# Patient Record
Sex: Male | Born: 1985 | Race: White | Hispanic: No | Marital: Single | Smoking: Former smoker
Health system: Southern US, Community
[De-identification: ages and names within clinical notes are randomized; demographics above are authoritative.]

## PROBLEM LIST (undated history)

## (undated) DIAGNOSIS — Z872 Personal history of diseases of the skin and subcutaneous tissue: Secondary | ICD-10-CM

## (undated) HISTORY — DX: Personal history of diseases of the skin and subcutaneous tissue: Z87.2

## (undated) HISTORY — PX: WISDOM TOOTH EXTRACTION: SHX21

---

## 2019-11-07 ENCOUNTER — Encounter: Payer: Self-pay | Admitting: Osteopathic Medicine

## 2019-11-07 ENCOUNTER — Other Ambulatory Visit: Payer: Self-pay

## 2019-11-07 ENCOUNTER — Ambulatory Visit (INDEPENDENT_AMBULATORY_CARE_PROVIDER_SITE_OTHER): Payer: Managed Care, Other (non HMO)

## 2019-11-07 ENCOUNTER — Ambulatory Visit (INDEPENDENT_AMBULATORY_CARE_PROVIDER_SITE_OTHER): Payer: Managed Care, Other (non HMO) | Admitting: Osteopathic Medicine

## 2019-11-07 VITALS — BP 144/82 | HR 85 | Temp 98.2°F | Ht 74.0 in | Wt 265.0 lb

## 2019-11-07 DIAGNOSIS — Z872 Personal history of diseases of the skin and subcutaneous tissue: Secondary | ICD-10-CM

## 2019-11-07 DIAGNOSIS — M25472 Effusion, left ankle: Secondary | ICD-10-CM | POA: Insufficient documentation

## 2019-11-07 DIAGNOSIS — Z8249 Family history of ischemic heart disease and other diseases of the circulatory system: Secondary | ICD-10-CM

## 2019-11-07 DIAGNOSIS — Z833 Family history of diabetes mellitus: Secondary | ICD-10-CM | POA: Diagnosis not present

## 2019-11-07 DIAGNOSIS — R030 Elevated blood-pressure reading, without diagnosis of hypertension: Secondary | ICD-10-CM | POA: Diagnosis not present

## 2019-11-07 MED ORDER — MELOXICAM 15 MG PO TABS: ORAL_TABLET | ORAL | 3 refills | Status: DC

## 2019-11-07 NOTE — Progress Notes (Signed)
Subjective:    I'm seeing this patient as a consultation for: Dr. Emeterio Reeve  CC: Left ankle swelling  HPI: For over a month now this 33 year old male has had pain in his left ankle, worse with the first few steps in the morning and after sitting for a while, also worse with HIIT.  Localized at the talar dome is without radiation, moderate swelling, no known trauma.  I reviewed the past medical history, family history, social history, surgical history, and allergies today and no changes were needed.  Please see the problem list section below in epic for further details.  Past Medical History: Past Medical History:  Diagnosis Date  . History of acne    Past Surgical History: Past Surgical History:  Procedure Laterality Date  . WISDOM TOOTH EXTRACTION     Social History: Social History   Socioeconomic History  . Marital status: Single    Spouse name: Not on file  . Number of children: Not on file  . Years of education: Not on file  . Highest education level: Not on file  Occupational History  . Occupation: Lobbyist: Information systems manager   Tobacco Use  . Smoking status: Former Smoker    Years: 5.00    Types: Cigarettes    Quit date: 2016    Years since quitting: 4.9  . Smokeless tobacco: Never Used  Substance and Sexual Activity  . Alcohol use: Yes    Alcohol/week: 2.0 standard drinks    Types: 2 Standard drinks or equivalent per week  . Drug use: Not Currently    Types: Marijuana    Comment: many years ago  . Sexual activity: Not Currently    Partners: Female    Birth control/protection: Condom  Other Topics Concern  . Not on file  Social History Narrative  . Not on file   Social Determinants of Health   Financial Resource Strain:   . Difficulty of Paying Living Expenses: Not on file  Food Insecurity:   . Worried About Charity fundraiser in the Last Year: Not on file  . Ran Out of Food in the Last Year: Not on file  Transportation Needs:    . Lack of Transportation (Medical): Not on file  . Lack of Transportation (Non-Medical): Not on file  Physical Activity:   . Days of Exercise per Week: Not on file  . Minutes of Exercise per Session: Not on file  Stress:   . Feeling of Stress : Not on file  Social Connections:   . Frequency of Communication with Friends and Family: Not on file  . Frequency of Social Gatherings with Friends and Family: Not on file  . Attends Religious Services: Not on file  . Active Member of Clubs or Organizations: Not on file  . Attends Archivist Meetings: Not on file  . Marital Status: Not on file   Family History: Family History  Problem Relation Age of Onset  . Diabetes Mother   . High blood pressure Mother   . Diabetes Father   . High blood pressure Father   . Cancer Father   . Heart attack Maternal Grandfather    Allergies: Not on File Medications: See med rec.  Review of Systems: No headache, visual changes, nausea, vomiting, diarrhea, constipation, dizziness, abdominal pain, skin rash, fevers, chills, night sweats, weight loss, swollen lymph nodes, body aches, joint swelling, muscle aches, chest pain, shortness of breath, mood changes, visual or auditory hallucinations.  Objective:   General: Well Developed, well nourished, and in no acute distress.  Neuro:  Extra-ocular muscles intact, able to move all 4 extremities, sensation grossly intact.  Deep tendon reflexes tested were normal. Psych: Alert and oriented, mood congruent with affect. ENT:  Ears and nose appear unremarkable.  Hearing grossly normal. Neck: Unremarkable overall appearance, trachea midline.  No visible thyroid enlargement. Eyes: Conjunctivae and lids appear unremarkable.  Pupils equal and round. Skin: Warm and dry, no rashes noted.  Cardiovascular: Pulses palpable, no extremity edema. Left ankle: Mild visible and palpable swelling with a fluid wave. Range of motion is full in all directions. Strength  is 5/5 in all directions. Stable lateral and medial ligaments; squeeze test and kleiger test unremarkable; Tender palpation at the medial and lateral talar domes. No pain at base of 5th MT; No tenderness over cuboid; No tenderness over N spot or navicular prominence No tenderness on posterior aspects of lateral and medial malleolus No sign of peroneal tendon subluxations; Negative tarsal tunnel tinel's Able to walk 4 steps.  Impression and Recommendations:   This case required medical decision making of moderate complexity.  Left ankle swelling Likely internal derangement, ankle swelling with tenderness over the medial and lateral talar domes. He will cross train for the next month, nonimpact. There is significant gelling and morning stiffness. If insufficient improvement at the follow-up visit we will consider a rheumatoid work-up. Adding meloxicam, x-rays, I would like him to see Dr. Raeford Razor for custom molded orthotics. Return to see me in a month, MRI if no better.   ___________________________________________ Gwen Her. Dianah Field, M.D., ABFM., CAQSM. Primary Care and Sports Medicine [facility]  Adjunct Professor of [employer] [facility] [employer]

## 2019-11-07 NOTE — Progress Notes (Signed)
HPI: Barry Herrera is a 33 y.o. male who  has no past medical history on file.  he presents to [facility] today, 11/07/19,  for chief complaint of: New to establish care Elevated BP  Foot pain   Foot/Ankle issue - consult sports medicine, see Dr. Mcneil Sober notes for full details.  Skin concern on back of neck, looks like a bruise but it is a really strange shape and patient cannot recall how he might of gotten this.  Does not really hurt him.  No itching or other irritation.  Blood pressure elevated on intake and on recheck.  Patient states that his BP tends to run high when checked in doctor/dentist offices but he has never been told he has high blood pressure.  He has a home blood pressure monitor that he does not really use.    Past medical, surgical, social and family history reviewed:  Patient Active Problem List   Diagnosis Date Noted  . Left ankle swelling 11/07/2019    Past Surgical History:  Procedure Laterality Date  . WISDOM TOOTH EXTRACTION      Social History   Tobacco Use  . Smoking status: Former Smoker    Years: 5.00    Types: Cigarettes    Quit date: 2016    Years since quitting: 4.9  . Smokeless tobacco: Never Used  Substance Use Topics  . Alcohol use: Yes    Alcohol/week: 2.0 standard drinks    Types: 2 Standard drinks or equivalent per week    Family History  Problem Relation Age of Onset  . Diabetes Mother   . High blood pressure Mother   . Diabetes Father   . High blood pressure Father   . Cancer Father   . Heart attack Maternal Grandfather      Current medication list and allergy/intolerance information reviewed:    No outpatient medications have been marked as taking for the 11/07/19 encounter (Office Visit) with Emeterio Reeve, DO.    Not on File    Review of Systems:  Constitutional:  No  fever, no chills, No recent illness, No unintentional weight changes. No significant fatigue.   HEENT:  No  headache, no vision change, no hearing change, No sore throat, No  sinus pressure  Cardiac: No  chest pain, No  pressure, No palpitations, No  Orthopnea  Respiratory:  No  shortness of breath. No  Cough  Gastrointestinal: No  abdominal pain, No  nausea, No  vomiting,  No  blood in stool, No  diarrhea, No  constipation   Musculoskeletal: +new myalgia/arthralgia  Skin: +Rash, No other wounds/concerning lesions  Genitourinary: No  incontinence, No  abnormal genital bleeding, No abnormal genital discharge  Hem/Onc: No  easy bruising/bleeding, No  abnormal lymph node  Endocrine: No cold intolerance,  No heat intolerance. No polyuria/polydipsia/polyphagia   Neurologic: No  weakness, No  dizziness, No  slurred speech/focal weakness/facial droop  Psychiatric: No  concerns with depression, No  concerns with anxiety, No sleep problems, No mood problems  Exam:  BP (!) 144/82 (BP Location: Left Arm, Patient Position: Sitting, Cuff Size: Large)   Pulse 85   Temp 98.2 F (36.8 C) (Oral)   Ht 6\' 2"  (1.88 m)   Wt 265 lb 0.6 oz (120.2 kg)   BMI 34.03 kg/m   Constitutional: VS see above. General Appearance: alert, well-developed, well-nourished, NAD  Eyes: Normal lids and conjunctive, non-icteric sclera  Ears, Nose, Mouth, Throat: TM normal bilaterally.  Neck: No masses, trachea midline. No thyroid enlargement. No tenderness/mass appreciated. No lymphadenopathy  Respiratory: Normal respiratory effort. no wheeze, no rhonchi, no rales  Cardiovascular: S1/S2 normal, no murmur, no rub/gallop auscultated. RRR. No lower extremity edema.   Gastrointestinal: Nontender, no masses. No hepatomegaly, no splenomegaly. No hernia appreciated.   Musculoskeletal: Gait normal. No clubbing/cyanosis of digits.   Neurological: Normal balance/coordination. No tremor.   Skin: warm, dry, intact. Strange shape linear bruise on upper T-spine area see photo   Psychiatric: Normal judgment/insight. Normal  mood and affect. Oriented x3.    No results found for this or any previous visit (from the past 72 hour(s)).  No results found.           ASSESSMENT/PLAN: The primary encounter diagnosis was Elevated blood pressure reading. Diagnoses of History of acne, Family history of essential hypertension, Family history of diabetes mellitus in first degree relative, and Left ankle swelling were also pertinent to this visit.    Orders Placed This Encounter  Procedures  . XR Ankle Left  . CBC  . COMPLETE METABOLIC PANEL WITH GFR  . LIPID SCREENING  . TSH  . Ambulatory referral to Dr. Clearance Coots, [facility] for Orthotics    Meds ordered this encounter  Medications  . meloxicam (MOBIC) 15 MG tablet    Sig: One tab PO qAM with a meal for 2 weeks, then daily prn pain.    Dispense:  30 tablet    Refill:  3    Patient Instructions  Labs today  Will call/message w/ results BP at home: 130/80 or less! Call/message me in a week w/ BP numbers  Follow up if needed for repeat wart treatments Follow up as directed by Dr T for ankle          Visit summary with medication list and pertinent instructions was printed for patient to review. All questions at time of visit were answered - patient instructed to contact office with any additional concerns or updates. ER/RTC precautions were reviewed with the patient.    Please note: voice recognition software was used to produce this document, and typos may escape review. Please contact Dr. Sheppard Coil for any needed clarifications.     Follow-up plan: Return for VISIT WITH SPORTS MEDICINE FOR ORTHOPEDIC ISSUE AS DIRECTED .

## 2019-11-07 NOTE — Assessment & Plan Note (Signed)
Likely internal derangement, ankle swelling with tenderness over the medial and lateral talar domes. He will cross train for the next month, nonimpact. There is significant gelling and morning stiffness. If insufficient improvement at the follow-up visit we will consider a rheumatoid work-up. Adding meloxicam, x-rays, I would like him to see Dr. Raeford Razor for custom molded orthotics. Return to see me in a month, MRI if no better.

## 2019-11-07 NOTE — Patient Instructions (Addendum)
Labs today  Will call/message w/ results BP at home: 130/80 or less! Call/message me in a week w/ BP numbers  Follow up if needed for repeat wart treatments Follow up as directed by Dr T for ankle

## 2019-11-08 LAB — CBC
HCT: 45.4 % (ref 38.5–50.0)
Hemoglobin: 15.8 g/dL (ref 13.2–17.1)
MCH: 31.3 pg (ref 27.0–33.0)
MCHC: 34.8 g/dL (ref 32.0–36.0)
MCV: 89.9 fL (ref 80.0–100.0)
MPV: 10.3 fL (ref 7.5–12.5)
Platelets: 189 10*3/uL (ref 140–400)
RBC: 5.05 10*6/uL (ref 4.20–5.80)
RDW: 12.6 % (ref 11.0–15.0)
WBC: 4.5 10*3/uL (ref 3.8–10.8)

## 2019-11-08 LAB — COMPLETE METABOLIC PANEL WITH GFR
AG Ratio: 1.7 (calc) (ref 1.0–2.5)
ALT: 52 U/L — ABNORMAL HIGH (ref 9–46)
AST: 35 U/L (ref 10–40)
Albumin: 4.3 g/dL (ref 3.6–5.1)
Alkaline phosphatase (APISO): 33 U/L — ABNORMAL LOW (ref 36–130)
BUN: 8 mg/dL (ref 7–25)
CO2: 28 mmol/L (ref 20–32)
Calcium: 9.1 mg/dL (ref 8.6–10.3)
Chloride: 104 mmol/L (ref 98–110)
Creat: 0.94 mg/dL (ref 0.60–1.35)
GFR, Est African American: 125 mL/min/{1.73_m2} (ref >=60)
GFR, Est Non African American: 108 mL/min/{1.73_m2} (ref >=60)
Globulin: 2.5 g/dL (calc) (ref 1.9–3.7)
Glucose, Bld: 98 mg/dL (ref 65–99)
Potassium: 4.3 mmol/L (ref 3.5–5.3)
Sodium: 141 mmol/L (ref 135–146)
Total Bilirubin: 0.9 mg/dL (ref 0.2–1.2)
Total Protein: 6.8 g/dL (ref 6.1–8.1)

## 2019-11-08 LAB — LIPID PANEL
Cholesterol: 233 mg/dL — ABNORMAL HIGH (ref <200)
HDL: 39 mg/dL — ABNORMAL LOW (ref >=40)
LDL Cholesterol (Calc): 171 mg/dL (calc) — ABNORMAL HIGH
Non-HDL Cholesterol (Calc): 194 mg/dL (calc) — ABNORMAL HIGH (ref <130)
Total CHOL/HDL Ratio: 6 (calc) — ABNORMAL HIGH (ref <5.0)
Triglycerides: 104 mg/dL (ref <150)

## 2019-11-08 LAB — TSH: TSH: 0.95 mIU/L (ref 0.40–4.50)

## 2019-11-12 ENCOUNTER — Other Ambulatory Visit: Payer: Self-pay

## 2019-11-12 ENCOUNTER — Ambulatory Visit: Payer: Managed Care, Other (non HMO) | Admitting: Family Medicine

## 2019-11-12 DIAGNOSIS — R600 Localized edema: Secondary | ICD-10-CM | POA: Diagnosis not present

## 2019-11-12 DIAGNOSIS — M25472 Effusion, left ankle: Secondary | ICD-10-CM

## 2019-11-12 NOTE — Progress Notes (Signed)
Barry Herrera - 33 y.o. male MRN [id number]  Date of birth: 1986/06/01  SUBJECTIVE:  Including CC & ROS.  No chief complaint on file.   Barry Herrera is a 33 y.o. male that is presenting with left ankle pain.  The pain is occurring over the ankle joint and lateral malleolus.  He has been improving since taking time off from working out.  He was performing a fairly intense workout regimen.  He has a history of a ankle fracture when he was younger.  Feels some of the pain when he is running as well.  Seems to be localized to the joint.    Review of Systems  Constitutional: Negative for fever.  HENT: Negative for congestion.   Respiratory: Negative for cough.   Cardiovascular: Negative for chest pain.  Gastrointestinal: Negative for abdominal pain.  Musculoskeletal: Negative for back pain.  Skin: Negative for color change.  Neurological: Negative for weakness.  Hematological: Negative for adenopathy.    HISTORY: Past Medical, Surgical, Social, and Family History Reviewed & Updated per EMR.   Pertinent Historical Findings include:  Past Medical History:  Diagnosis Date  . History of acne     Past Surgical History:  Procedure Laterality Date  . WISDOM TOOTH EXTRACTION      Not on File  Family History  Problem Relation Age of Onset  . Diabetes Mother   . High blood pressure Mother   . Diabetes Father   . High blood pressure Father   . Cancer Father   . Heart attack Maternal Grandfather      Social History   Socioeconomic History  . Marital status: Single    Spouse name: Not on file  . Number of children: Not on file  . Years of education: Not on file  . Highest education level: Not on file  Occupational History  . Occupation: Lobbyist: Information systems manager   Tobacco Use  . Smoking status: Former Smoker    Years: 5.00    Types: Cigarettes    Quit date: 2016    Years since quitting: 4.9  . Smokeless tobacco: Never Used  Substance and Sexual  Activity  . Alcohol use: Yes    Alcohol/week: 2.0 standard drinks    Types: 2 Standard drinks or equivalent per week  . Drug use: Not Currently    Types: Marijuana    Comment: many years ago  . Sexual activity: Not Currently    Partners: Female    Birth control/protection: Condom  Other Topics Concern  . Not on file  Social History Narrative  . Not on file   Social Determinants of Health   Financial Resource Strain:   . Difficulty of Paying Living Expenses: Not on file  Food Insecurity:   . Worried About Charity fundraiser in the Last Year: Not on file  . Ran Out of Food in the Last Year: Not on file  Transportation Needs:   . Lack of Transportation (Medical): Not on file  . Lack of Transportation (Non-Medical): Not on file  Physical Activity:   . Days of Exercise per Week: Not on file  . Minutes of Exercise per Session: Not on file  Stress:   . Feeling of Stress : Not on file  Social Connections:   . Frequency of Communication with Friends and Family: Not on file  . Frequency of Social Gatherings with Friends and Family: Not on file  . Attends Religious Services: Not on file  .  Active Member of Clubs or Organizations: Not on file  . Attends Archivist Meetings: Not on file  . Marital Status: Not on file  Intimate Partner Violence:   . Fear of Current or Ex-Partner: Not on file  . Emotionally Abused: Not on file  . Physically Abused: Not on file  . Sexually Abused: Not on file     PHYSICAL EXAM:  VS: BP 118/84   Ht 6\' 2"  (1.88 m)   Wt 265 lb (120.2 kg)   BMI 34.02 kg/m  Physical Exam Gen: NAD, alert, cooperative with exam, well-appearing ENT: normal lips, normal nasal mucosa,  Eye: normal EOM, normal conjunctiva and lids CV:  no edema, +2 pedal pulses   Resp: no accessory muscle use, non-labored,  Skin: no rashes, no areas of induration  Neuro: normal tone, normal sensation to touch Psych:  normal insight, alert and oriented MSK:  Left ankle:    No ecchymosis  Swelling over the soft tissue of the lateral malleolus  More pes planus  Normal ROM  NVI   Patient was fitted for a standard, cushioned, semi-rigid orthotic. The orthotic was heated and afterward the patient stood on the orthotic blank positioned on the orthotic stand. The patient was positioned in subtalar neutral position and 10 degrees of ankle dorsiflexion in a weight bearing stance. After completion of molding, a stable base was applied to the orthotic blank. The blank was ground to a stable position for weight bearing. Size: 12 Base: Blue EVA Additional Posting and Padding: None The patient ambulated these, and they were very comfortable.     ASSESSMENT & PLAN:   Left ankle swelling Pain with his intense workout regimen. Improving with rest.  - orthotics  - counseled on supportive care.

## 2019-11-12 NOTE — Assessment & Plan Note (Signed)
Pain with his intense workout regimen. Improving with rest.  - orthotics  - counseled on supportive care.

## 2019-12-05 ENCOUNTER — Other Ambulatory Visit: Payer: Self-pay

## 2019-12-05 ENCOUNTER — Ambulatory Visit (INDEPENDENT_AMBULATORY_CARE_PROVIDER_SITE_OTHER): Payer: Managed Care, Other (non HMO) | Admitting: Sports Medicine

## 2019-12-05 DIAGNOSIS — K644 Residual hemorrhoidal skin tags: Secondary | ICD-10-CM | POA: Diagnosis not present

## 2019-12-05 DIAGNOSIS — M25472 Effusion, left ankle: Secondary | ICD-10-CM

## 2019-12-05 NOTE — Assessment & Plan Note (Signed)
Overall improving considerably. I have recommended that he do a high-fiber diet or add stool softeners. Which hazel wipes. He can also contact us back if he has any recurrence of symptoms and we can certainly also send in Anusol [facility] suppositories.

## 2019-12-05 NOTE — Assessment & Plan Note (Signed)
Barry Herrera returns, he is done extremely well with meloxicam, he got custom orthotics from Dr. Raeford Razor, and he is really pain-free. He has not gone back to HIIT training yet. Unfortunately he did develop a hemorrhoid and I have advised that he wait for this to heal completely before going back to high intensity training. Certainly if his symptoms recur we will get an MRI of the ankle.

## 2019-12-05 NOTE — Progress Notes (Signed)
    Procedures performed today:    None.  Independent interpretation of tests performed by another provider:   None.  Impression and Recommendations:    Left ankle swelling Barry Herrera returns, he is done extremely well with meloxicam, he got custom orthotics from Dr. Raeford Razor, and he is really pain-free. He has not gone back to HIIT training yet. Unfortunately he did develop a hemorrhoid and I have advised that he wait for this to heal completely before going back to high intensity training. Certainly if his symptoms recur we will get an MRI of the ankle.  External hemorrhoid Overall improving considerably. I have recommended that he do a high-fiber diet or add stool softeners. Which hazel wipes. He can also contact us back if he has any recurrence of symptoms and we can certainly also send in Anusol [facility] suppositories.    ___________________________________________ Barry Her. Dianah Field, M.D., ABFM., CAQSM. Primary Care and [place] Instructor of [place] [employer] [facility] [employer]

## 2019-12-23 ENCOUNTER — Encounter: Payer: Self-pay | Admitting: Osteopathic Medicine

## 2019-12-23 ENCOUNTER — Telehealth (INDEPENDENT_AMBULATORY_CARE_PROVIDER_SITE_OTHER): Payer: Managed Care, Other (non HMO) | Admitting: Osteopathic Medicine

## 2019-12-23 VITALS — Wt 268.2 lb

## 2019-12-23 DIAGNOSIS — L649 Androgenic alopecia, unspecified: Secondary | ICD-10-CM

## 2019-12-23 MED ORDER — MINOXIDIL 5 % EX FOAM: 0.5000 | Freq: Every day | CUTANEOUS | 11 refills | Status: DC

## 2019-12-23 NOTE — Progress Notes (Signed)
Virtual Visit via Video (App used: MyChart) Note  I connected with      Barry Herrera on 12/23/19 at 9:00 AM  by a telemedicine application and verified that I am speaking with the correct person using two identifiers.  Patient is at home I am in office   I discussed the limitations of evaluation and management by telemedicine and the availability of in person appointments. The patient expressed understanding and agreed to proceed.  History of Present Illness: Barry Herrera is a 34 y.o. male who would like to discuss hair loss prevention/treatment       Observations/Objective: Wt 268 lb 3.2 oz (121.7 kg)   BMI 34.43 kg/m  BP Readings from Last 3 Encounters:  11/12/19 118/84  11/07/19 (!) 144/82   Exam: Normal Speech.  NAD  Lab and Radiology Results No results found for this or any previous visit (from the past 72 hour(s)). No results found.     Assessment and Plan: 34 y.o. 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  Discussed options for minoxidil, finasteride  Pt would like to stick w/ topical   Advised long term treatment needed Can add Finasteride if desired   PDMP not reviewed this encounter. No orders of the defined types were placed in this encounter.  Meds ordered this encounter  Medications  . Minoxidil 5 % FOAM    Sig: Apply 0.5 capsules topically daily.    Dispense:  120 g    Refill:  11   Patient Instructions  Finasteride (Propecia) tablets What is this medicine? FINASTERIDE (fi NAS teer ide) is used to treat male pattern baldness in men only. This medicine is not for use in women. This medicine may be used for other purposes; ask your health care provider or pharmacist if you have questions. COMMON BRAND NAME(S): Propecia What should I tell my health care provider before I take this medicine? They need to know if you have any of these conditions:  liver disease  an unusual or allergic reaction to  finasteride, other medicines, foods, dyes, or preservatives  pregnant or trying to get pregnant  breast-feeding How should I use this medicine? Take this medicine by mouth with a glass of water. Follow the directions on the prescription label. You can take this medicine with or without food. Take your doses at regular intervals. Do not take your medicine more often than directed. Talk to your pediatrician regarding the use of this medicine in children. Special care may be needed. Overdosage: If you think you have taken too much of this medicine contact a poison control center or emergency room at once. NOTE: This medicine is only for you. Do not share this medicine with others. What if I miss a dose? If you miss a dose, take it as soon as you can. If it is almost time for your next dose, take only that dose. Do not take double or extra doses. What may interact with this medicine?  saw palmetto or other dietary supplements This list may not describe all possible interactions. Give your health care provider a list of all the medicines, herbs, non-prescription drugs, or dietary supplements you use. Also tell them if you smoke, drink alcohol, or use illegal drugs. Some items may interact with your medicine. What should I watch for while using this medicine? It may take at least three months of daily use of this medicine before you notice an improvement in you hair loss. You must continue to  take this medicine to maintain the results. If you stop taking this medicine, the effect will be reversed within 12 months. Do not donate blood while you are taking this medicine. This will prevent giving this medicine to a pregnant male through a blood transfusion. Ask your doctor or health care professional when it is safe to donate blood after you stop taking this medicine. Women who are pregnant or may get pregnant must not handle broken or crushed finasteride tablets. The active ingredient could harm the  unborn baby. If a pregnant woman comes into contact with broken or crushed tablets she should check with her doctor or health care professional. Exposure to whole tablets is not expected to cause harm as long as they are not swallowed. This medicine can interfere with PSA laboratory tests for prostate cancer. If you are scheduled to have a lab test for prostate cancer, tell your doctor or health care professional that you are taking this medicine. This medicine may increase your risk of getting some cancers, like breast cancer. Talk with your doctor. What side effects may I notice from receiving this medicine? Side effects that you should report to your doctor or health care professional as soon as possible:  allergic reactions like skin rash, itching or hives, swelling of the face, lips, or tongue  changes in breast like lumps, pain or fluids leaking from the nipple  pain in the testicles Side effects that usually do not require medical attention (report to your doctor or health care professional if they continue or are bothersome):  sexual difficulties like decreased sexual desire or ability to get an erection  small amount of semen released during sex This list may not describe all possible side effects. Call your doctor for medical advice about side effects. You may report side effects to FDA at 1-800-FDA-1088. Where should I keep my medicine? Keep out of the reach of children. Store at room temperature between 15 and 30 degrees C (59 and 86 degrees F). Protect from moisture. Keep container tightly closed. Throw away any unused medicine after the expiration date. NOTE: This sheet is a summary. It may not cover all possible information. If you have questions about this medicine, talk to your doctor, pharmacist, or health care provider.  2020 Elsevier/Gold Standard (2016-12-22 09:36:49)  Minoxidil topical solution or foam What is this medicine? MINOXIDIL (mi NOX i dill) is used to  increase new hair growth in cases of hereditary hair loss. When applied to the scalp, this medicine can promote hair growth in men with male pattern baldness. This medicine can also help women with thin hair and frontal hair loss. Women should not use extra-strength minoxidil products. This medicine may be used for other purposes; ask your health care provider or pharmacist if you have questions. COMMON BRAND NAME(S): Rogaine What should I tell my health care provider before I take this medicine? They need to know if you have any of these conditions:  frontal hair loss or receding hairline  no family history of hair loss  sudden or patchy hair loss  unknown reason for hair loss  your scalp is red, inflamed, infected or painful  an unusual or allergic reaction to minoxidil, other medicines, foods, dyes, or preservatives  pregnant or trying to get pregnant  breast-feeding How should I use this medicine? This medicine is for external use on the scalp only. Do not take by mouth. Follow the directions that come with the product. The hair and scalp should be dry  before you use this medicine. You do not need to shampoo your hair before each application. Do not use more often than directed. Talk to your pediatrician regarding the use of this medicine in children. This medicine is not approved for use in children. Overdosage: If you think you have taken too much of this medicine contact a poison control center or emergency room at once. NOTE: This medicine is only for you. Do not share this medicine with others. What if I miss a dose? If you miss a dose, use it as soon as you can. If it is almost time for your next dose, use only that dose. Do not use double or extra doses. What may interact with this medicine? Interactions are not expected. Do not use any other medicines on the scalp without asking your doctor or health care professional. This list may not describe all possible interactions.  Give your health care provider a list of all the medicines, herbs, non-prescription drugs, or dietary supplements you use. Also tell them if you smoke, drink alcohol, or use illegal drugs. Some items may interact with your medicine. What should I watch for while using this medicine? This medicine must be used on a regular basis for your hair to regrow. It may take 2 to 4 months of regular use before you notice any improvement. It is important to continue to use this product to maintain regrowth of hair. Once you stop using it, the regrown hair will usually fall out within 3 months. If you do not see any new hair growth after 4 months, stop using this product and contact your doctor or health care professional. Do not get this medicine in your eyes, nose, mouth, or on other sensitive areas. If you do, rinse off with plenty of cool tap water. Always wash your hands after use. Do not apply if your scalp is cut, scraped, or sunburned. Some people may notice changes in hair color or texture after using this medicine. What side effects may I notice from receiving this medicine? Side effects that you should report to your doctor or health care professional as soon as possible:  chest pain or palpitations  dizziness or fainting  skin rash, blisters, or itching  sudden weight gain  swelling of the hands or feet Side effects that usually do not require medical attention (report to your doctor or health care professional if they continue or are bothersome):  headache  redness, irritation and itching at the site of application  unusual hair growth, on the face, arm, and back This list may not describe all possible side effects. Call your doctor for medical advice about side effects. You may report side effects to FDA at 1-800-FDA-1088. Where should I keep my medicine? Keep out of the reach of children. Store at room temperature between 20 and 25 degrees C (68 and 77 degrees F). Some products may be  flammable. Keep away from heat, fire or flame. Throw away any unused medicine after the expiration date. NOTE: This sheet is a summary. It may not cover all possible information. If you have questions about this medicine, talk to your doctor, pharmacist, or health care provider.  2020 Elsevier/Gold Standard (2008-05-25 14:26:21)    Instructions sent via MyChart. If MyChart not available, pt was given option for info via personal e-mail w/ no guarantee of protected health info over unsecured e-mail communication, and MyChart sign-up instructions were sent to patient.   Follow Up Instructions: Return if symptoms worsen or fail  to improve.    I discussed the assessment and treatment plan with the patient. The patient was provided an opportunity to ask questions and all were answered. The patient agreed with the plan and demonstrated an understanding of the instructions.   The patient was advised to call back or seek an in-person evaluation if any new concerns, if symptoms worsen or if the condition fails to improve as anticipated.  15 minutes of non-face-to-face time was provided during this encounter.      . . . . . . . . . . . . . Marland Kitchen                   Historical information moved to improve visibility of documentation.  Past Medical History:  Diagnosis Date  . History of acne    Past Surgical History:  Procedure Laterality Date  . WISDOM TOOTH EXTRACTION     Social History   Tobacco Use  . Smoking status: Former Smoker    Years: 5.00    Types: Cigarettes    Quit date: 2016    Years since quitting: 5.0  . Smokeless tobacco: Never Used  Substance Use Topics  . Alcohol use: Yes    Alcohol/week: 2.0 standard drinks    Types: 2 Standard drinks or equivalent per week   family history includes Cancer in his father; Diabetes in his father and mother; Heart attack in his maternal grandfather; High blood pressure in his father and  mother.  Medications: Current Outpatient Medications  Medication Sig Dispense Refill  . meloxicam (MOBIC) 15 MG tablet One tab PO qAM with a meal for 2 weeks, then daily prn pain. (Patient not taking: Reported on 12/23/2019) 30 tablet 3  . Minoxidil 5 % FOAM Apply 0.5 capsules topically daily. 120 g 11   No current facility-administered medications for this visit.   No Known Allergies

## 2019-12-23 NOTE — Patient Instructions (Addendum)
Finasteride (Propecia) tablets What is this medicine? FINASTERIDE (fi NAS teer ide) is used to treat male pattern baldness in men only. This medicine is not for use in women. This medicine may be used for other purposes; ask your health care provider or pharmacist if you have questions. COMMON BRAND NAME(S): Propecia What should I tell my health care provider before I take this medicine? They need to know if you have any of these conditions:  liver disease  an unusual or allergic reaction to finasteride, other medicines, foods, dyes, or preservatives  pregnant or trying to get pregnant  breast-feeding How should I use this medicine? Take this medicine by mouth with a glass of water. Follow the directions on the prescription label. You can take this medicine with or without food. Take your doses at regular intervals. Do not take your medicine more often than directed. Talk to your pediatrician regarding the use of this medicine in children. Special care may be needed. Overdosage: If you think you have taken too much of this medicine contact a poison control center or emergency room at once. NOTE: This medicine is only for you. Do not share this medicine with others. What if I miss a dose? If you miss a dose, take it as soon as you can. If it is almost time for your next dose, take only that dose. Do not take double or extra doses. What may interact with this medicine?  saw palmetto or other dietary supplements This list may not describe all possible interactions. Give your health care provider a list of all the medicines, herbs, non-prescription drugs, or dietary supplements you use. Also tell them if you smoke, drink alcohol, or use illegal drugs. Some items may interact with your medicine. What should I watch for while using this medicine? It may take at least three months of daily use of this medicine before you notice an improvement in you hair loss. You must continue to take this  medicine to maintain the results. If you stop taking this medicine, the effect will be reversed within 12 months. Do not donate blood while you are taking this medicine. This will prevent giving this medicine to a pregnant male through a blood transfusion. Ask your doctor or health care professional when it is safe to donate blood after you stop taking this medicine. Women who are pregnant or may get pregnant must not handle broken or crushed finasteride tablets. The active ingredient could harm the unborn baby. If a pregnant woman comes into contact with broken or crushed tablets she should check with her doctor or health care professional. Exposure to whole tablets is not expected to cause harm as long as they are not swallowed. This medicine can interfere with PSA laboratory tests for prostate cancer. If you are scheduled to have a lab test for prostate cancer, tell your doctor or health care professional that you are taking this medicine. This medicine may increase your risk of getting some cancers, like breast cancer. Talk with your doctor. What side effects may I notice from receiving this medicine? Side effects that you should report to your doctor or health care professional as soon as possible:  allergic reactions like skin rash, itching or hives, swelling of the face, lips, or tongue  changes in breast like lumps, pain or fluids leaking from the nipple  pain in the testicles Side effects that usually do not require medical attention (report to your doctor or health care professional if they continue or are  bothersome):  sexual difficulties like decreased sexual desire or ability to get an erection  small amount of semen released during sex This list may not describe all possible side effects. Call your doctor for medical advice about side effects. You may report side effects to FDA at 1-800-FDA-1088. Where should I keep my medicine? Keep out of the reach of children. Store at room  temperature between 15 and 30 degrees C (59 and 86 degrees F). Protect from moisture. Keep container tightly closed. Throw away any unused medicine after the expiration date. NOTE: This sheet is a summary. It may not cover all possible information. If you have questions about this medicine, talk to your doctor, pharmacist, or health care provider.  2020 Elsevier/Gold Standard (2016-12-22 09:36:49)  Minoxidil topical solution or foam What is this medicine? MINOXIDIL (mi NOX i dill) is used to increase new hair growth in cases of hereditary hair loss. When applied to the scalp, this medicine can promote hair growth in men with male pattern baldness. This medicine can also help women with thin hair and frontal hair loss. Women should not use extra-strength minoxidil products. This medicine may be used for other purposes; ask your health care provider or pharmacist if you have questions. COMMON BRAND NAME(S): Rogaine  What should I tell my health care provider before I take this medicine? They need to know if you have any of these conditions:  frontal hair loss or receding hairline  no family history of hair loss  sudden or patchy hair loss  unknown reason for hair loss  your scalp is red, inflamed, infected or painful  an unusual or allergic reaction to minoxidil, other medicines, foods, dyes, or preservatives  pregnant or trying to get pregnant  breast-feeding How should I use this medicine? This medicine is for external use on the scalp only. Do not take by mouth. Follow the directions that come with the product. The hair and scalp should be dry before you use this medicine. You do not need to shampoo your hair before each application. Do not use more often than directed. Talk to your pediatrician regarding the use of this medicine in children. This medicine is not approved for use in children. Overdosage: If you think you have taken too much of this medicine contact a poison control  center or emergency room at once. NOTE: This medicine is only for you. Do not share this medicine with others. What if I miss a dose? If you miss a dose, use it as soon as you can. If it is almost time for your next dose, use only that dose. Do not use double or extra doses. What may interact with this medicine? Interactions are not expected. Do not use any other medicines on the scalp without asking your doctor or health care professional. This list may not describe all possible interactions. Give your health care provider a list of all the medicines, herbs, non-prescription drugs, or dietary supplements you use. Also tell them if you smoke, drink alcohol, or use illegal drugs. Some items may interact with your medicine. What should I watch for while using this medicine? This medicine must be used on a regular basis for your hair to regrow. It may take 2 to 4 months of regular use before you notice any improvement. It is important to continue to use this product to maintain regrowth of hair. Once you stop using it, the regrown hair will usually fall out within 3 months. If you do not see  any new hair growth after 4 months, stop using this product and contact your doctor or health care professional. Do not get this medicine in your eyes, nose, mouth, or on other sensitive areas. If you do, rinse off with plenty of cool tap water. Always wash your hands after use. Do not apply if your scalp is cut, scraped, or sunburned. Some people may notice changes in hair color or texture after using this medicine. What side effects may I notice from receiving this medicine? Side effects that you should report to your doctor or health care professional as soon as possible:  chest pain or palpitations  dizziness or fainting  skin rash, blisters, or itching  sudden weight gain  swelling of the hands or feet Side effects that usually do not require medical attention (report to your doctor or health care  professional if they continue or are bothersome):  headache  redness, irritation and itching at the site of application  unusual hair growth, on the face, arm, and back This list may not describe all possible side effects. Call your doctor for medical advice about side effects. You may report side effects to FDA at 1-800-FDA-1088. Where should I keep my medicine? Keep out of the reach of children. Store at room temperature between 20 and 25 degrees C (68 and 77 degrees F). Some products may be flammable. Keep away from heat, fire or flame. Throw away any unused medicine after the expiration date. NOTE: This sheet is a summary. It may not cover all possible information. If you have questions about this medicine, talk to your doctor, pharmacist, or health care provider.  2020 Elsevier/Gold Standard (2008-05-25 14:26:21)

## 2020-03-04 ENCOUNTER — Other Ambulatory Visit: Payer: Self-pay | Admitting: Sports Medicine

## 2020-03-04 DIAGNOSIS — M25472 Effusion, left ankle: Secondary | ICD-10-CM

## 2020-03-12 ENCOUNTER — Ambulatory Visit (INDEPENDENT_AMBULATORY_CARE_PROVIDER_SITE_OTHER): Payer: Managed Care, Other (non HMO)

## 2020-03-12 ENCOUNTER — Ambulatory Visit (INDEPENDENT_AMBULATORY_CARE_PROVIDER_SITE_OTHER): Payer: Managed Care, Other (non HMO) | Admitting: Sports Medicine

## 2020-03-12 ENCOUNTER — Encounter: Payer: Self-pay | Admitting: Sports Medicine

## 2020-03-12 ENCOUNTER — Other Ambulatory Visit: Payer: Self-pay

## 2020-03-12 DIAGNOSIS — M79605 Pain in left leg: Secondary | ICD-10-CM

## 2020-03-12 DIAGNOSIS — S99921A Unspecified injury of right foot, initial encounter: Secondary | ICD-10-CM | POA: Insufficient documentation

## 2020-03-12 MED ORDER — CALCIUM CARBONATE-VITAMIN D 600-400 MG-UNIT PO TABS: 1.0000 | ORAL_TABLET | Freq: Two times a day (BID) | ORAL | 11 refills | Status: DC

## 2020-03-12 NOTE — Assessment & Plan Note (Addendum)
For less than 1 week this pleasant 34 year old male who has been exercising and trying to get into shape has developed pain along his posterior/medial tibia on the left, mild swelling. He has been doing more walk/running. On exam his pain is severe, and more reminiscent of a tibial stress fracture. I would like him to cut his intensity in his speed back by about 50% for the next 3 weeks. We are adding a stirrup Aircast, he will continue meloxicam, getting tib-fib x-rays, adding a calcium and vitamin D supplement. I like to see him back in approximately 4 weeks. I will probably get an MRI of his tibia if no better.  Our Aircast did not fit his foot, we will need to order an Aircast air stirrup ankle brace large, left, 02AL.

## 2020-03-12 NOTE — Progress Notes (Addendum)
    Procedures performed today:    None.  Independent interpretation of notes and tests performed by another provider:   None.  Brief History, Exam, Impression, and Recommendations:    Injury of right third toe Stubbed his toe, suspect injury to the middle phalanx of the third toe. Buddy taped the second and third toes together, adding x-rays.   Left leg pain For less than 1 week this pleasant 34 year old male who has been exercising and trying to get into shape has developed pain along his posterior/medial tibia on the left, mild swelling. He has been doing more walk/running. On exam his pain is severe, and more reminiscent of a tibial stress fracture. I would like him to cut his intensity in his speed back by about 50% for the next 3 weeks. We are adding a stirrup Aircast, he will continue meloxicam, getting tib-fib x-rays, adding a calcium and vitamin D supplement. I like to see him back in approximately 4 weeks. I will probably get an MRI of his tibia if no better.  Our Aircast did not fit his foot, we will need to order an Aircast air stirrup ankle brace large, left, 02AL.    ___________________________________________ Gwen Her. Dianah Field, M.D., ABFM., CAQSM. Primary Care and [place] In[employer] of [place] [employer] [facility] [employer]

## 2020-03-12 NOTE — Assessment & Plan Note (Signed)
Stubbed his toe, suspect injury to the middle phalanx of the third toe. Buddy taped the second and third toes together, adding x-rays.

## 2020-03-12 NOTE — Patient Instructions (Signed)
Get x-rays of your right third toe and left tibia/fibula. Wear the Aircast whenever out and about on your feet. Okay to use meloxicam and vitamin D/calcium. Cut intensity (speed, distance, time) by about 50% for the next 3 weeks. Return to see me in 4 weeks.

## 2020-04-09 ENCOUNTER — Ambulatory Visit (INDEPENDENT_AMBULATORY_CARE_PROVIDER_SITE_OTHER): Payer: Managed Care, Other (non HMO) | Admitting: Sports Medicine

## 2020-04-09 ENCOUNTER — Other Ambulatory Visit: Payer: Self-pay

## 2020-04-09 ENCOUNTER — Encounter: Payer: Self-pay | Admitting: Sports Medicine

## 2020-04-09 DIAGNOSIS — S99921D Unspecified injury of right foot, subsequent encounter: Secondary | ICD-10-CM

## 2020-04-09 DIAGNOSIS — M79605 Pain in left leg: Secondary | ICD-10-CM | POA: Diagnosis not present

## 2020-04-09 NOTE — Assessment & Plan Note (Signed)
X-rays were concerning for nondisplaced fracture, this is now resolved.

## 2020-04-09 NOTE — Assessment & Plan Note (Signed)
This is a pleasant 34 year old male, he has been exercising, trying to get in shape, he did lose about 5 pounds with intermittent fasting, he had been doing some walking running and developed pain along the posterior/medial tibia on the left, we shut him down for a while, he has had almost complete resolution of his tibial pain. He did not fit our current Aircast so we are going to order him a larger 1, 02AL model, but never got this. He is doing calcium and vitamin D supplementation, he is going to start to ramp his intensity back up to preinjury levels and we can do a virtual visit if he has a recurrence of pain at which point we would get him the stirrup Aircast and likely get an MRI.

## 2020-04-09 NOTE — Progress Notes (Signed)
    Procedures performed today:    None.  Independent interpretation of notes and tests performed by another provider:   None.  Brief History, Exam, Impression, and Recommendations:    Left leg pain This is a pleasant 34 year old male, he has been exercising, trying to get in shape, he did lose about 5 pounds with intermittent fasting, he had been doing some walking running and developed pain along the posterior/medial tibia on the left, we shut him down for a while, he has had almost complete resolution of his tibial pain. He did not fit our current Aircast so we are going to order him a larger 1, 02AL model, but never got this. He is doing calcium and vitamin D supplementation, he is going to start to ramp his intensity back up to preinjury levels and we can do a virtual visit if he has a recurrence of pain at which point we would get him the stirrup Aircast and likely get an MRI.  Injury of right third toe X-rays were concerning for nondisplaced fracture, this is now resolved.    ___________________________________________ Gwen Her. Dianah Field, M.D., ABFM., CAQSM. Primary Care and [place] Instructor of [place] [facility]

## 2020-04-29 ENCOUNTER — Encounter: Payer: Self-pay | Admitting: Sports Medicine

## 2020-04-29 ENCOUNTER — Ambulatory Visit (INDEPENDENT_AMBULATORY_CARE_PROVIDER_SITE_OTHER): Payer: Managed Care, Other (non HMO) | Admitting: Sports Medicine

## 2020-04-29 DIAGNOSIS — M79605 Pain in left leg: Secondary | ICD-10-CM | POA: Diagnosis not present

## 2020-04-29 NOTE — Progress Notes (Signed)
    Procedures performed today:    None.  Independent interpretation of notes and tests performed by another provider:   None.  Brief History, Exam, Impression, and Recommendations:    Left leg pain This is a pleasant 34 year old male, for almost 2 months now he has had pain in his shin, he has been working out and trying to get into shape. Pain is fairly localized along the medial tibial shaft consistent with a stress fracture, he did respond initially to conservative treatment but symptoms worsened. At this point we are going to shut him down from running, he can cross train with cycling, and upper body but nothing impact and no high impact calisthenics. We will order him the 02AL Aircast from [place]. At this point I am going to get an MRI.    ___________________________________________ Gwen Her. Dianah Field, M.D., ABFM., CAQSM. Primary Care and [place] Instructor of [place] of [facility]

## 2020-04-29 NOTE — Assessment & Plan Note (Signed)
This is a pleasant 34 year old male, for almost 2 months now he has had pain in his shin, he has been working out and trying to get into shape. Pain is fairly localized along the medial tibial shaft consistent with a stress fracture, he did respond initially to conservative treatment but symptoms worsened. At this point we are going to shut him down from running, he can cross train with cycling, and upper body but nothing impact and no high impact calisthenics. We will order him the 02AL Aircast from [place]. At this point I am going to get an MRI.

## 2020-05-02 ENCOUNTER — Other Ambulatory Visit: Payer: Self-pay

## 2020-05-02 ENCOUNTER — Ambulatory Visit (INDEPENDENT_AMBULATORY_CARE_PROVIDER_SITE_OTHER): Payer: Managed Care, Other (non HMO)

## 2020-05-02 DIAGNOSIS — M79605 Pain in left leg: Secondary | ICD-10-CM | POA: Diagnosis not present

## 2020-05-10 ENCOUNTER — Telehealth: Payer: Self-pay | Admitting: Osteopathic Medicine

## 2020-05-10 NOTE — Telephone Encounter (Signed)
My main question is how long should I stay off the leg? I assume I can still lift upper body amd and for now. Is there anything I can do to speed the healing up? Mychart message from pt.

## 2020-05-10 NOTE — Telephone Encounter (Signed)
I am sorry to say, but 4 weeks.  You can do upper body lifting yes, but I would avoid anything with significant impact to the legs.

## 2020-05-11 NOTE — Telephone Encounter (Signed)
Advised pt of recommendations. He expressed understanding.

## 2020-08-03 ENCOUNTER — Telehealth: Payer: Self-pay | Admitting: Osteopathic Medicine

## 2020-08-03 NOTE — Telephone Encounter (Signed)
Patient would like to know if he can do an antibody test. States if it cant do it here, where can he do this at.

## 2020-08-05 ENCOUNTER — Encounter: Payer: Self-pay | Admitting: Osteopathic Medicine

## 2020-08-05 NOTE — Telephone Encounter (Signed)
All testing requires visit  I am not typically ordering antibody tests as these do not change management / vaccine recommendations

## 2020-08-06 NOTE — Telephone Encounter (Signed)
Patient notified, voiced his understanding

## 2020-08-27 ENCOUNTER — Ambulatory Visit (INDEPENDENT_AMBULATORY_CARE_PROVIDER_SITE_OTHER): Payer: Managed Care, Other (non HMO) | Admitting: Nurse Practitioner

## 2020-08-27 ENCOUNTER — Encounter: Payer: Self-pay | Admitting: Nurse Practitioner

## 2020-08-27 VITALS — BP 119/80 | HR 75 | Ht 74.0 in | Wt 244.0 lb

## 2020-08-27 DIAGNOSIS — Z23 Encounter for immunization: Secondary | ICD-10-CM | POA: Diagnosis not present

## 2020-08-27 DIAGNOSIS — M25532 Pain in left wrist: Secondary | ICD-10-CM

## 2020-08-27 DIAGNOSIS — M25531 Pain in right wrist: Secondary | ICD-10-CM

## 2020-08-27 DIAGNOSIS — Z0000 Encounter for general adult medical examination without abnormal findings: Secondary | ICD-10-CM

## 2020-08-27 DIAGNOSIS — B070 Plantar wart: Secondary | ICD-10-CM | POA: Insufficient documentation

## 2020-08-27 DIAGNOSIS — Z6831 Body mass index (BMI) 31.0-31.9, adult: Secondary | ICD-10-CM

## 2020-08-27 LAB — COMPLETE METABOLIC PANEL WITH GFR
AG Ratio: 1.8 (calc) (ref 1.0–2.5)
ALT: 21 U/L (ref 9–46)
AST: 19 U/L (ref 10–40)
Albumin: 4.5 g/dL (ref 3.6–5.1)
Alkaline phosphatase (APISO): 41 U/L (ref 36–130)
BUN: 12 mg/dL (ref 7–25)
CO2: 30 mmol/L (ref 20–32)
Calcium: 9.5 mg/dL (ref 8.6–10.3)
Chloride: 104 mmol/L (ref 98–110)
Creat: 1.12 mg/dL (ref 0.60–1.35)
GFR, Est African American: 100 mL/min/{1.73_m2} (ref >=60)
GFR, Est Non African American: 86 mL/min/{1.73_m2} (ref >=60)
Globulin: 2.5 g/dL (calc) (ref 1.9–3.7)
Glucose, Bld: 101 mg/dL — ABNORMAL HIGH (ref 65–99)
Potassium: 4.5 mmol/L (ref 3.5–5.3)
Sodium: 140 mmol/L (ref 135–146)
Total Bilirubin: 0.9 mg/dL (ref 0.2–1.2)
Total Protein: 7 g/dL (ref 6.1–8.1)

## 2020-08-27 LAB — CBC WITH DIFFERENTIAL/PLATELET
Absolute Monocytes: 392 cells/uL (ref 200–950)
Basophils Absolute: 18 cells/uL (ref 0–200)
Basophils Relative: 0.4 %
Eosinophils Absolute: 32 cells/uL (ref 15–500)
Eosinophils Relative: 0.7 %
HCT: 47.8 % (ref 38.5–50.0)
Hemoglobin: 16.6 g/dL (ref 13.2–17.1)
Lymphs Abs: 1895 cells/uL (ref 850–3900)
MCH: 31.5 pg (ref 27.0–33.0)
MCHC: 34.7 g/dL (ref 32.0–36.0)
MCV: 90.7 fL (ref 80.0–100.0)
MPV: 10 fL (ref 7.5–12.5)
Monocytes Relative: 8.7 %
Neutro Abs: 2165 cells/uL (ref 1500–7800)
Neutrophils Relative %: 48.1 %
Platelets: 177 10*3/uL (ref 140–400)
RBC: 5.27 10*6/uL (ref 4.20–5.80)
RDW: 12.3 % (ref 11.0–15.0)
Total Lymphocyte: 42.1 %
WBC: 4.5 10*3/uL (ref 3.8–10.8)

## 2020-08-27 LAB — LIPID PANEL
Cholesterol: 227 mg/dL — ABNORMAL HIGH (ref <200)
HDL: 39 mg/dL — ABNORMAL LOW (ref >=40)
LDL Cholesterol (Calc): 164 mg/dL (calc) — ABNORMAL HIGH
Non-HDL Cholesterol (Calc): 188 mg/dL (calc) — ABNORMAL HIGH (ref <130)
Total CHOL/HDL Ratio: 5.8 (calc) — ABNORMAL HIGH (ref <5.0)
Triglycerides: 119 mg/dL (ref <150)

## 2020-08-27 NOTE — Patient Instructions (Signed)
Sexuality: STD's are prevalent among people your age. Choose your sexual partners carefully and avoid having sexual encounters without protection.    If you smoke and would like to quit, please let me know. There are resources available for persons who would like to quit smoking that can make this easier. We recommend NO smoking of any substance (including vaping).   If you drink alcohol, please drink within safe limits (<=14/week and <=4 drinks/occasion for males, <=7/weeks and <= 3 drinks/occasion for females). There are many risks with excessive alcohol use to your health.    Diet: We recommend you adjust your caloric intake to maintain  or achieve ideal body weight, to reduce intake of dietary saturated fat and total fat, to limit sodium intake by avoiding high sodium foods and not adding table salt, and to maintain adequate dietary potassium and calcium preferably from fresh fruits, vegetables, and low-fat dairy products.   Your cholesterol has been high in the past, we will check this again today, but I recommend that you follow a low fat, carbohydrate, and saturated fat diet to help reduce your risk of cardiovascular disease in the future.   Exercise: It is important to get regular exercise- you should work to get at least 30 minutes of cardiovascular exercise in at least 5 days a week. If you are not currently active, you can work up to this. High intensity exercise is exercise that gets your heart rate up. At your age, your target heart rate would be about 93-157 beats per minute. Your maximum heart rate for your age is about 59. You should try to avoid going over this heart rate when you work out.    Injury prevention: Be safe- wear your safety belts and safety helmets, use a smoke detector and carbon monoxide detector in your home, and do not smoke near bedding or upholstery.   Dental health: It is very important to visit a dentist at least every 6 months for a cleaning. It is recommended to  brush and floss your teeth at least twice a day to maintain proper oral hygiene.

## 2020-08-27 NOTE — Progress Notes (Signed)
BP 119/80   Pulse 75   Ht 6\' 2"  (1.88 m)   Wt 244 lb (110.7 kg)   SpO2 99%   BMI 31.33 kg/m    Subjective:    Patient ID: Barry Herrera, male    DOB: [birth year], 34 y.o.   MRN: [id number]  HPI: Barry Herrera is a 34 y.o. male presenting on 08/27/2020 for comprehensive medical examination. Current medical problems include:  Patient Active Problem List   Diagnosis Date Noted  . Left leg pain 03/12/2020  . Injury of right third toe 03/12/2020  . External hemorrhoid 12/05/2019  . Left ankle swelling 11/07/2019   He reports he has lost weight since the beginning of the year by eating on an intermittent fasting schedule. He is on a 16 fasting/8 eating regimen. He also reports starting to workout last November using high intensity interval training. He reports that he feels better overall and is sleeping better than before these changes.   He currently lives with: Lives alone  Interim Problems from his last visit: yes- plantar warts- frozen off at his visit in December of last year.  He also reports occasional popping sound and interim short term pain in the wrist when dorsiflexed on a hard surface. Also reports bilateral wrist pain and weakness when doing pushups.   He reports regular vision exams q1-5y: no He reports regular dental exams q 55m: yes His diet consists of: Mixture of healthy and unhealthy options. He reports he eats very healthy about 5 days a week.  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  He endorses nictoine use of vaping- he started mid August of this year. He is working to stop.  He denies illegal substance use.  He is not currently sexually active. He denies concerns today about STI  He endorses concerns about skin changes today: Plantar wart on the bottom of his foot. He denies concerns about bowel changes today.  He  denies concerns about bladder changes today.   Depression Screen done today and results listed below:  Depression screen [facility] 2/9 11/07/2019  Decreased Interest 0  Down, Depressed, Hopeless 0  PHQ - 2 Score 0  Altered sleeping 0  Tired, decreased energy 0  Change in appetite 0  Feeling bad or failure about yourself  0  Trouble concentrating 0  Moving slowly or fidgety/restless 0  Suicidal thoughts 0  PHQ-9 Score 0    The patient does not have a history of falls. I did not complete a risk assessment for falls. A plan of care for falls was not documented.   Past Medical History:  Past Medical History:  Diagnosis Date  . History of acne     Surgical History:  Past Surgical History:  Procedure Laterality Date  . WISDOM TOOTH EXTRACTION      Medications:  Current Outpatient Medications on File Prior to Visit  Medication Sig  . Biotin 10 MG CAPS Take 1 capsule by mouth.  . Cholecalciferol (VITAMIN D) 50 MCG (2000 UT) tablet Take 2,000 Units by mouth daily.  . Multiple Vitamins-Minerals (MULTIVITAMIN WITH MINERALS) tablet Take 1 tablet by mouth daily.  . Omega-3 Fatty Acids (FISH OIL) 1000 MG CAPS Take 1 capsule by mouth.   No current facility-administered medications on file prior to visit.    Allergies:  No Known Allergies  Social History:  Social History  Socioeconomic History  . Marital status: Single    Spouse name: Not on file  . Number of children: Not on file  . Years of education: Not on file  . Highest education level: Not on file  Occupational History  . Occupation: Lobbyist: Information systems manager   Tobacco Use  . Smoking status: Former Smoker    Years: 5.00    Types: Cigarettes    Quit date: 2016    Years since quitting: 5.7  . Smokeless tobacco: Never Used  Vaping Use  . Vaping Use: Never used  Substance and Sexual Activity  . Alcohol use: Yes    Alcohol/week: 2.0 standard drinks    Types: 2 Standard drinks or equivalent per week  .  Drug use: Not Currently    Types: Marijuana    Comment: many years ago  . Sexual activity: Not Currently    Partners: Female    Birth control/protection: Condom  Other Topics Concern  . Not on file  Social History Narrative  . Not on file   Social Determinants of Health   Financial Resource Strain:   . Difficulty of Paying Living Expenses: Not on file  Food Insecurity:   . Worried About Charity fundraiser in the Last Year: Not on file  . Ran Out of Food in the Last Year: Not on file  Transportation Needs:   . Lack of Transportation (Medical): Not on file  . Lack of Transportation (Non-Medical): Not on file  Physical Activity:   . Days of Exercise per Week: Not on file  . Minutes of Exercise per Session: Not on file  Stress:   . Feeling of Stress : Not on file  Social Connections:   . Frequency of Communication with Friends and Family: Not on file  . Frequency of Social Gatherings with Friends and Family: Not on file  . Attends Religious Services: Not on file  . Active Member of Clubs or Organizations: Not on file  . Attends Archivist Meetings: Not on file  . Marital Status: Not on file  Intimate Partner Violence:   . Fear of Current or Ex-Partner: Not on file  . Emotionally Abused: Not on file  . Physically Abused: Not on file  . Sexually Abused: Not on file   Social History   Tobacco Use  Smoking Status Former Smoker  . Years: 5.00  . Types: Cigarettes  . Quit date: 2016  . Years since quitting: 5.7  Smokeless Tobacco Never Used   Social History   Substance and Sexual Activity  Alcohol Use Yes  . Alcohol/week: 2.0 standard drinks  . Types: 2 Standard drinks or equivalent per week    Family History:  Family History  Problem Relation Age of Onset  . Diabetes Mother   . High blood pressure Mother   . Diabetes Father   . High blood pressure Father   . Cancer Father   . Heart attack Maternal Grandfather     Past medical history, surgical  history, medications, allergies, family history and social history reviewed with patient today and changes made to appropriate areas of the chart.   All ROS negative except what is listed above and in the HPI.      Objective:    BP 119/80   Pulse 75   Ht 6\' 2"  (1.88 m)   Wt 244 lb (110.7 kg)   SpO2 99%   BMI 31.33 kg/m   Wt Readings from Last 3  Encounters:  08/27/20 244 lb (110.7 kg)  03/12/20 266 lb (120.7 kg)  12/23/19 268 lb 3.2 oz (121.7 kg)    Physical Exam Vitals and nursing note reviewed.  Constitutional:      Appearance: Normal appearance.  HENT:     Head: Normocephalic.     Right Ear: Tympanic membrane normal.     Left Ear: Tympanic membrane normal.     Nose: Nose normal.     Mouth/Throat:     Mouth: Mucous membranes are moist.     Pharynx: Oropharynx is clear.  Eyes:     Extraocular Movements: Extraocular movements intact.     Conjunctiva/sclera: Conjunctivae normal.     Pupils: Pupils are equal, round, and reactive to light.  Neck:     Vascular: No carotid bruit.  Cardiovascular:     Rate and Rhythm: Normal rate and regular rhythm.     Pulses: Normal pulses.     Heart sounds: Normal heart sounds.  Pulmonary:     Effort: Pulmonary effort is normal.     Breath sounds: Normal breath sounds.  Chest:     Chest wall: No tenderness.  Abdominal:     General: Abdomen is flat. Bowel sounds are normal. There is no distension.     Palpations: Abdomen is soft.     Tenderness: There is no abdominal tenderness. There is no right CVA tenderness, left CVA tenderness, guarding or rebound.  Musculoskeletal:        General: Normal range of motion.     Cervical back: Normal range of motion and neck supple.     Right lower leg: No edema.     Left lower leg: No edema.       Feet:     Comments: No erythema, tenderness, joint edema, ganglion cyst presence, or decreased range of motion noted bilaterally in the wrists.  Strength is equal bilaterally.  No signs of inflammation  or infection or injury present.  Feet:     Comments: Cryotherapy with liquid nitrogen to the plantar surface of the left foot for plantar warts of varying sizes.  3 passes of cryotherapy utilized given the size and continue present despite previous liquid nitrogen treatment.  Areas cleansed and bandages placed.  Recommend patient continue monitoring. Lymphadenopathy:     Cervical: No cervical adenopathy.  Skin:    General: Skin is warm and dry.     Capillary Refill: Capillary refill takes less than 2 seconds.  Neurological:     General: No focal deficit present.     Mental Status: He is alert and oriented to person, place, and time.  Psychiatric:        Mood and Affect: Mood normal.        Behavior: Behavior normal.        Thought Content: Thought content normal.        Judgment: Judgment normal.     Results for orders placed or performed in visit on 11/07/19  CBC  Result Value Ref Range   WBC 4.5 3.8 - 10.8 Thousand/uL   RBC 5.05 4.20 - 5.80 Million/uL   Hemoglobin 15.8 13.2 - 17.1 g/dL   HCT 45.4 38 - 50 %   MCV 89.9 80.0 - 100.0 fL   MCH 31.3 27.0 - 33.0 pg   MCHC 34.8 32.0 - 36.0 g/dL   RDW 12.6 11.0 - 15.0 %   Platelets 189 140 - 400 Thousand/uL   MPV 10.3 7.5 - 12.5 fL  COMPLETE METABOLIC PANEL  WITH GFR  Result Value Ref Range   Glucose, Bld 98 65 - 99 mg/dL   BUN 8 7 - 25 mg/dL   Creat 0.94 0.60 - 1.35 mg/dL   GFR, Est Non African American 108 > OR = 60 mL/min/1.82m2   GFR, Est African American 125 > OR = 60 mL/min/1.25m2   BUN/Creatinine Ratio NOT APPLICABLE 6 - 22 (calc)   Sodium 141 135 - 146 mmol/L   Potassium 4.3 3.5 - 5.3 mmol/L   Chloride 104 98 - 110 mmol/L   CO2 28 20 - 32 mmol/L   Calcium 9.1 8.6 - 10.3 mg/dL   Total Protein 6.8 6.1 - 8.1 g/dL   Albumin 4.3 3.6 - 5.1 g/dL   Globulin 2.5 1.9 - 3.7 g/dL (calc)   AG Ratio 1.7 1.0 - 2.5 (calc)   Total Bilirubin 0.9 0.2 - 1.2 mg/dL   Alkaline phosphatase (APISO) 33 (L) 36 - 130 U/L   AST 35 10 - 40 U/L    ALT 52 (H) 9 - 46 U/L  LIPID SCREENING  Result Value Ref Range   Cholesterol 233 (H) <200 mg/dL   HDL 39 (L) > OR = 40 mg/dL   Triglycerides 104 <150 mg/dL   LDL Cholesterol (Calc) 171 (H) mg/dL (calc)   Total CHOL/HDL Ratio 6.0 (H) <5.0 (calc)   Non-HDL Cholesterol (Calc) 194 (H) <130 mg/dL (calc)  TSH  Result Value Ref Range   TSH 0.95 0.40 - 4.50 mIU/L      Assessment & Plan:   Problem List Items Addressed This Visit    None      LABORATORY TESTING:  Health maintenance labs ordered today : CBC, CMP, Lipids - STI testing: deferred  IMMUNIZATIONS:   - Tdap: Tetanus vaccination status reviewed: last tetanus booster within 10 years. - Influenza: Administered today - Pneumovax: Not applicable - Prevnar: Not applicable - HPV: Not applicable - Shingrix: Not applicable - COVID-: J and J 07/2020   SCREENING: - Colonoscopy: Plan to start at age 43 or sooner if symptoms require  Discussed with patient purpose of the colonoscopy is to detect colon cancer at curable precancerous or Diamantina Edinger stages    PATIENT COUNSELING:    Sexuality: STD's are prevalent among people your age. Choose your sexual partners carefully and avoid having sexual encounters without protection.    If you smoke and would like to quit, please let me know. There are resources available for persons who would like to quit smoking that can make this easier. We recommend NO smoking of any substance (including vaping).   If you drink alcohol, please drink within safe limits (<=14/week and <=4 drinks/occasion for males, <=7/weeks and <= 3 drinks/occasion for females). There are many risks with excessive alcohol use to your health.    Diet: We recommend you adjust your caloric intake to maintain  or achieve ideal body weight, to reduce intake of dietary saturated fat and total fat, to limit sodium intake by avoiding high sodium foods and not adding table salt, and to maintain adequate dietary potassium and calcium  preferably from fresh fruits, vegetables, and low-fat dairy products.   Your cholesterol has been high in the past, we will check this again today, but I recommend that you follow a low fat, carbohydrate, and saturated fat diet to help reduce your risk of cardiovascular disease in the future.   Exercise: It is important to get regular exercise- you should work to get at least 30 minutes of cardiovascular exercise  in at least 5 days a week. If you are not currently active, you can work up to this. High intensity exercise is exercise that gets your heart rate up. At your age, your target heart rate would be about 93-157 beats per minute. Your maximum heart rate for your age is about 63. You should try to avoid going over this heart rate when you work out.    Injury prevention: Be safe- wear your safety belts and safety helmets, use a smoke detector and carbon monoxide detector in your home, and do not smoke near bedding or upholstery.   Dental health: It is very important to visit a dentist at least every 6 months for a cleaning. It is recommended to brush and floss your teeth at least twice a day to maintain proper oral hygiene.   Follow up plan: NEXT PREVENTATIVE PHYSICAL DUE IN 1 YEAR. No follow-ups on file.

## 2020-08-27 NOTE — Assessment & Plan Note (Signed)
Patient is working on current diet and exercise regimen that has helped him lose greater than 20 pounds since January of this year. Recommend continuing his current diet and exercise regimen. Patient praised for success thus far and discussed the positive aspects this has on his overall health. Will obtain labs today to monitor lipids and blood sugar. Follow-up in approximately 1 year or sooner if needed for annual physical exam.

## 2020-08-27 NOTE — Assessment & Plan Note (Signed)
Annual physical exam for overall healthy 34 year old male. Continue current diet and exercise regimen for her overall health and weight loss. Recommend healthy dietary habits. Recommend quitting vaping. We will obtain labs today to evaluate complete blood count, metabolic panel, and lipids. Plan to follow-up in 1 year or sooner if needed for annual physical exam.

## 2020-08-27 NOTE — Assessment & Plan Note (Signed)
Bilateral wrist pain noted mostly on the dorsal surface with excess weight and dorsiflexion of the wrist. Given the patient's symptoms and presentation this is most likely due to weekend muscles and suspected stretching of the tendons within the wrist with dorsiflexion. Patient provided with exercises to help strengthen the wrists. Recommend follow-up with sports medicine symptoms persist or worsen or if injury occurs.

## 2020-08-29 NOTE — Progress Notes (Signed)
Your cholesterol is elevated. Some people have high cholesterol from diet and others will have high cholesterol due to genes. I recommend that we start a medication called atorvastatin to help lower your cholesterol and help reduce your risks of cardiovascular disease in the future.   Were you fasting when your labs were taken? If so- your blood sugar was slightly elevated. If we start the statin, we can recheck this in about 12 weeks to make sure that you are not at risk for diabetes.   Everything else is looking good. Let me know about the medication for cholesterol.

## 2020-09-15 ENCOUNTER — Other Ambulatory Visit: Payer: Self-pay | Admitting: Nurse Practitioner

## 2020-09-15 ENCOUNTER — Encounter: Payer: Self-pay | Admitting: Nurse Practitioner

## 2020-09-15 DIAGNOSIS — E782 Mixed hyperlipidemia: Secondary | ICD-10-CM | POA: Insufficient documentation

## 2020-09-15 DIAGNOSIS — R7301 Impaired fasting glucose: Secondary | ICD-10-CM | POA: Insufficient documentation

## 2021-10-11 ENCOUNTER — Encounter: Payer: Self-pay | Admitting: Emergency Medicine

## 2021-10-11 ENCOUNTER — Other Ambulatory Visit: Payer: Self-pay

## 2021-10-11 ENCOUNTER — Emergency Department
Admission: EM | Admit: 2021-10-11 | Discharge: 2021-10-11 | Disposition: A | Discharge status: Home / Self Care | Payer: Managed Care, Other (non HMO) | Source: Home / Self Care | Attending: Family Medicine | Admitting: Family Medicine

## 2021-10-11 DIAGNOSIS — H5789 Other specified disorders of eye and adnexa: Secondary | ICD-10-CM

## 2021-10-11 DIAGNOSIS — H00024 Hordeolum internum left upper eyelid: Secondary | ICD-10-CM

## 2021-10-11 MED ORDER — TOBRAMYCIN 0.3 % OP SOLN: 1.0000 [drp] | OPHTHALMIC | 0 refills | Status: DC

## 2021-10-11 NOTE — ED Triage Notes (Signed)
LT eye pain, pressure, crusty, x 1 week.

## 2021-10-11 NOTE — Discharge Instructions (Signed)
Use the antibiotic eyedrop in your left eye every 4 hours while awake.  Do this until eye discomfort resolves.  Likely 7 days Use warm compresses for 20 minutes every couple of hours See an eye doctor if not improving by Monday

## 2021-10-11 NOTE — ED Provider Notes (Signed)
Barry Herrera CARE    CSN: [id number] Arrival date & time: 10/11/21  1027      History   Chief Complaint Chief Complaint  Patient presents with   Eye Problem    HPI Barry Herrera is a 35 y.o. male.   HPI Patient's had some eye irritation and redness for the last week.  He sees "mucus" in his lower lid mostly in the morning.  Sometimes he feels a little blurry.  No trauma or injury.  No foreign body sensation.  No cold symptoms or runny nose  Past Medical History:  Diagnosis Date   History of acne     Patient Active Problem List   Diagnosis Date Noted   Mixed hyperlipidemia 09/15/2020   Elevated fasting glucose 09/15/2020   Encounter for annual physical exam 08/27/2020   Plantar wart of left foot 08/27/2020   Pain in both wrists 08/27/2020   BMI 31.0-31.9,adult 08/27/2020   Left leg pain 03/12/2020   Injury of right third toe 03/12/2020   External hemorrhoid 12/05/2019   Left ankle swelling 11/07/2019    Past Surgical History:  Procedure Laterality Date   WISDOM TOOTH EXTRACTION         Home Medications    Prior to Admission medications   Medication Sig Start Date End Date Taking? Authorizing Provider  tobramycin (TOBREX) 0.3 % ophthalmic solution Place 1 drop into the left eye every 4 (four) hours. 10/11/21  Yes Raylene Everts, MD  Cholecalciferol (VITAMIN D) 50 MCG (2000 UT) tablet Take 2,000 Units by mouth daily.    [provider]    Family History Family History  Problem Relation Age of Onset   Diabetes Mother    High blood pressure Mother    Diabetes Father    High blood pressure Father    Cancer Father    Heart attack Maternal Grandfather     Social History Social History   Tobacco Use   Smoking status: Former    Years: 5.00    Types: Cigarettes    Quit date: 2016    Years since quitting: 6.8   Smokeless tobacco: Never  Vaping Use   Vaping Use: Never used  Substance Use Topics   Alcohol use: Yes     Alcohol/week: 2.0 standard drinks    Types: 2 Standard drinks or equivalent per week   Drug use: Not Currently    Types: Marijuana    Comment: many years ago     Allergies   Patient has no known allergies.   Review of Systems Review of Systems See HPI  Physical Exam Triage Vital Signs ED Triage Vitals  Enc Vitals Group     BP 10/11/21 1112 137/87     Pulse Rate 10/11/21 1112 82     Resp 10/11/21 1112 18     Temp 10/11/21 1112 98.8 F (37.1 C)     Temp Source 10/11/21 1112 Oral     SpO2 10/11/21 1112 98 %     Weight 10/11/21 1113 245 lb (111.1 kg)     Height 10/11/21 1113 6\' 2"  (1.88 m)     Head Circumference --      Peak Flow --      Pain Score 10/11/21 1112 1     Pain Loc --      Pain Edu? --      Excl. in [place]? --    No data found.  Updated Vital Signs BP 137/87 (BP Location: Left Arm)  Pulse 82   Temp 98.8 F (37.1 C) (Oral)   Resp 18   Ht 6\' 2"  (1.88 m)   Wt 111.1 kg   SpO2 98%   BMI 31.46 kg/m   Visual Acuity Right Eye Distance: 20/20 Left Eye Distance: 20/20 Bilateral Distance: 20/20  Right Eye Near:   Left Eye Near:    Bilateral Near:   (Uncorrected)  Physical Exam Constitutional:      General: He is not in acute distress.    Appearance: Normal appearance. He is well-developed.  HENT:     Head: Normocephalic and atraumatic.  Eyes:     Pupils: Pupils are equal, round, and reactive to light.     Comments: Mild conjunctival and left.  New drainage or discharge.  Lids were everted.  No foreign body seen.  There is a deeper area on the underside of the upper lid laterally.  I believe this is a hordeolum.  Cardiovascular:     Rate and Rhythm: Normal rate.  Pulmonary:     Effort: Pulmonary effort is normal. No respiratory distress.  Abdominal:     General: There is no distension.     Palpations: Abdomen is soft.  Musculoskeletal:        General: Normal range of motion.     Cervical back: Normal range of motion.  Lymphadenopathy:      Cervical: No cervical adenopathy.  Skin:    General: Skin is warm and dry.  Neurological:     Mental Status: He is alert.  Psychiatric:        Mood and Affect: Mood normal.        Behavior: Behavior normal.     UC Treatments / Results  Labs (all labs ordered are listed, but only abnormal results are displayed) Labs Reviewed - No data to display  EKG   Radiology No results found.  Procedures Procedures (including critical care time)  Medications Ordered in UC Medications - No data to display  Initial Impression / Assessment and Plan / UC Course  I have reviewed the triage vital signs and the nursing notes.  Pertinent labs & imaging results that were available during my care of the patient were reviewed by me and considered in my medical decision making (see chart for details).     Will treat with antibiotic eyedrops.  See eye doctor fails to improve Final Clinical Impressions(s) / UC Diagnoses   Final diagnoses:  Eye irritation  Hordeolum internum of left upper eyelid     Discharge Instructions      Use the antibiotic eyedrop in your left eye every 4 hours while awake.  Do this until eye discomfort resolves.  Likely 7 days Use warm compresses for 20 minutes every couple of hours See an eye doctor if not improving by Monday   ED Prescriptions     Medication Sig Dispense Auth. Provider   tobramycin (TOBREX) 0.3 % ophthalmic solution Place 1 drop into the left eye every 4 (four) hours. 5 mL Raylene Everts, MD      PDMP not reviewed this encounter.   Raylene Everts, MD 10/11/21 [phone]

## 2021-12-01 ENCOUNTER — Ambulatory Visit: Payer: Managed Care, Other (non HMO) | Admitting: Sports Medicine

## 2021-12-01 ENCOUNTER — Other Ambulatory Visit: Payer: Self-pay

## 2021-12-01 DIAGNOSIS — E782 Mixed hyperlipidemia: Secondary | ICD-10-CM | POA: Diagnosis not present

## 2021-12-01 DIAGNOSIS — D2361 Other benign neoplasm of skin of right upper limb, including shoulder: Secondary | ICD-10-CM | POA: Insufficient documentation

## 2021-12-01 DIAGNOSIS — E291 Testicular hypofunction: Secondary | ICD-10-CM | POA: Diagnosis not present

## 2021-12-01 DIAGNOSIS — R238 Other skin changes: Secondary | ICD-10-CM

## 2021-12-01 NOTE — Assessment & Plan Note (Signed)
This is a pleasant 36 year old male, for over a month he is noted a 1 cm papule in his right upper arm, he does recall potentially scratching the area, and then the lesion grew. It really not tender, it is however unsightly. On exam this has the appearance of a hypertrophic scar versus keloid, dermatofibromas can present similarly. On further questioning it does sound to be improving in size, I have advised him to leave this alone for now, if it continues to bother him or if it increases in size we will start with an intralesional injection assuming this is a keloid/hypertrophic scar, if that fails we can do a full surgical excision.

## 2021-12-01 NOTE — Progress Notes (Signed)
° ° °  Procedures performed today:    None.  Independent interpretation of notes and tests performed by another provider:   None.  Brief History, Exam, Impression, and Recommendations:    Papule right upper arm This is a pleasant 36 year old male, for over a month he is noted a 1 cm papule in his right upper arm, he does recall potentially scratching the area, and then the lesion grew. It really not tender, it is however unsightly. On exam this has the appearance of a hypertrophic scar versus keloid, dermatofibromas can present similarly. On further questioning it does sound to be improving in size, I have advised him to leave this alone for now, if it continues to bother him or if it increases in size we will start with an intralesional injection assuming this is a keloid/hypertrophic scar, if that fails we can do a full surgical excision.  Mixed hyperlipidemia Catalina Antigua was one of Dr. Redgie Grayer patients, he agrees to establish with another provider, he does request that I tee him up with labs. He is requesting testosterone as well. He does understand that one of my medical partners would be managing any abnormalities in his labs.    ___________________________________________ Gwen Her. Dianah Field, M.D., ABFM., CAQSM. Primary Care and [place] Instructor of [place] of [facility] of Medicine

## 2021-12-01 NOTE — Assessment & Plan Note (Addendum)
Barry Herrera was one of Dr. Redgie Grayer patients, he agrees to establish with another provider, he does request that I tee him up with labs. He is requesting testosterone as well. He does understand that one of my medical partners would be managing any abnormalities in his labs.

## 2021-12-04 LAB — COMPREHENSIVE METABOLIC PANEL
AG Ratio: 1.7 (calc) (ref 1.0–2.5)
ALT: 68 U/L — ABNORMAL HIGH (ref 9–46)
AST: 32 U/L (ref 10–40)
Albumin: 4.6 g/dL (ref 3.6–5.1)
Alkaline phosphatase (APISO): 32 U/L — ABNORMAL LOW (ref 36–130)
BUN: 17 mg/dL (ref 7–25)
CO2: 30 mmol/L (ref 20–32)
Calcium: 9.7 mg/dL (ref 8.6–10.3)
Chloride: 103 mmol/L (ref 98–110)
Creat: 0.97 mg/dL (ref 0.60–1.26)
Globulin: 2.7 g/dL (calc) (ref 1.9–3.7)
Glucose, Bld: 99 mg/dL (ref 65–99)
Potassium: 4.5 mmol/L (ref 3.5–5.3)
Sodium: 141 mmol/L (ref 135–146)
Total Bilirubin: 0.9 mg/dL (ref 0.2–1.2)
Total Protein: 7.3 g/dL (ref 6.1–8.1)

## 2021-12-04 LAB — CBC
HCT: 48.9 % (ref 38.5–50.0)
Hemoglobin: 17.2 g/dL — ABNORMAL HIGH (ref 13.2–17.1)
MCH: 31.6 pg (ref 27.0–33.0)
MCHC: 35.2 g/dL (ref 32.0–36.0)
MCV: 89.9 fL (ref 80.0–100.0)
MPV: 9.9 fL (ref 7.5–12.5)
Platelets: 220 10*3/uL (ref 140–400)
RBC: 5.44 10*6/uL (ref 4.20–5.80)
RDW: 13.6 % (ref 11.0–15.0)
WBC: 5.1 10*3/uL (ref 3.8–10.8)

## 2021-12-04 LAB — TSH: TSH: 0.74 mIU/L (ref 0.40–4.50)

## 2021-12-04 LAB — TESTOSTERONE, FREE & TOTAL
Free Testosterone: 68.8 pg/mL (ref 35.0–155.0)
Testosterone, Total, LC-MS-MS: 245 ng/dL — ABNORMAL LOW (ref 250–1100)

## 2021-12-04 LAB — LIPID PANEL
Cholesterol: 262 mg/dL — ABNORMAL HIGH (ref <200)
HDL: 37 mg/dL — ABNORMAL LOW (ref >=40)
LDL Cholesterol (Calc): 190 mg/dL (calc) — ABNORMAL HIGH
Non-HDL Cholesterol (Calc): 225 mg/dL (calc) — ABNORMAL HIGH (ref <130)
Total CHOL/HDL Ratio: 7.1 (calc) — ABNORMAL HIGH (ref <5.0)
Triglycerides: 177 mg/dL — ABNORMAL HIGH (ref <150)

## 2022-01-17 ENCOUNTER — Other Ambulatory Visit: Payer: Self-pay

## 2022-01-17 ENCOUNTER — Ambulatory Visit: Payer: Managed Care, Other (non HMO) | Admitting: Medical-Surgical

## 2022-01-17 ENCOUNTER — Encounter: Payer: Self-pay | Admitting: Medical-Surgical

## 2022-01-17 VITALS — BP 139/88 | HR 68 | Resp 20 | Ht 74.0 in | Wt 263.1 lb

## 2022-01-17 DIAGNOSIS — Z7689 Persons encountering health services in other specified circumstances: Secondary | ICD-10-CM | POA: Diagnosis not present

## 2022-01-17 DIAGNOSIS — E291 Testicular hypofunction: Secondary | ICD-10-CM

## 2022-01-17 DIAGNOSIS — Z833 Family history of diabetes mellitus: Secondary | ICD-10-CM

## 2022-01-17 DIAGNOSIS — R7401 Elevation of levels of liver transaminase levels: Secondary | ICD-10-CM

## 2022-01-17 DIAGNOSIS — E7801 Familial hypercholesterolemia: Secondary | ICD-10-CM

## 2022-01-17 DIAGNOSIS — D582 Other hemoglobinopathies: Secondary | ICD-10-CM

## 2022-01-17 NOTE — Progress Notes (Signed)
°  HPI with pertinent ROS:   CC: Transfer of care  HPI: Pleasant 36 year old male presenting today for transfer of care to a new PCP.  Also would like to review labs that were checked last month after he saw Dr. Dianah Field.  After his visit last month and seeing his lab results, he started exercising and is made several dietary changes.  He is working to lose weight and get back in shape after taking 6 months away from the gym.  Blood pressure elevated on arrival.  Patient notes that he was very nervous coming and to discuss lab results today.  Concerns about testosterone since he had a low result at last check.  Also notes a mildly elevated hemoglobin.  ALT was also mildly elevated.  Total cholesterol was high with an LDL of 190.  I reviewed the past medical history, family history, social history, surgical history, and allergies today and no changes were needed.  Please see the problem list section below in epic for further details.   Physical exam:   General: Well Developed, well nourished, and in no acute distress.  Neuro: Alert and oriented x3, extra-ocular muscles intact, sensation grossly intact.  HEENT: Normocephalic, atraumatic, pupils equal round reactive to light, neck supple, no masses, no lymphadenopathy, thyroid nonpalpable.  Skin: Warm and dry, no rashes. Cardiac: Regular rate and rhythm, no murmurs rubs or gallops, no lower extremity edema.  Respiratory: Clear to auscultation bilaterally. Not using accessory muscles, speaking in full sentences.  Impression and Recommendations:    1. Encounter to establish care Reviewed available information and discussed care concerns with patient.   2. Male hypogonadism Last testosterone checked with a total testosterone of 245.  Rechecking testosterone and adding FSH/LH for further evaluation. - Testosterone - FSH/LH  3. Elevated hemoglobin (HCC) Rechecking CBC - CBC  4. Family history of diabetes mellitus Checking hemoglobin  A1c - Hemoglobin A1c  5. Elevated ALT measurement He does not take a lot of Tylenol and only drinks socially per his report.  Rechecking CMP. - COMPLETE METABOLIC PANEL WITH GFR  6. Familial hypercholesterolemia Discussed the nature of his high cholesterol.  With an LDL of 190 despite exercise, weight loss, and dietary efforts this is likely familial in nature.  His parents both do have issues with cholesterol as well as diabetes.  Reviewed options for treating elevations in cholesterol.  He is very nervous about taking medication every day and would prefer to work on dietary and lifestyle modifications as well as weight loss.  We will plan to do this for 6 months then recheck cholesterol and if still significantly elevated, he will consider medication at that time.  Return for follow up pending lab results. ___________________________________________ Clearnce Sorrel, DNP, APRN, FNP-BC Primary Care and Sports Medicine [place]

## 2022-01-18 LAB — CBC
HCT: 48.7 % (ref 38.5–50.0)
Hemoglobin: 16.9 g/dL (ref 13.2–17.1)
MCH: 31.6 pg (ref 27.0–33.0)
MCHC: 34.7 g/dL (ref 32.0–36.0)
MCV: 91 fL (ref 80.0–100.0)
MPV: 9.8 fL (ref 7.5–12.5)
Platelets: 188 10*3/uL (ref 140–400)
RBC: 5.35 10*6/uL (ref 4.20–5.80)
RDW: 12.9 % (ref 11.0–15.0)
WBC: 4.6 10*3/uL (ref 3.8–10.8)

## 2022-01-18 LAB — COMPLETE METABOLIC PANEL WITH GFR
AG Ratio: 1.6 (calc) (ref 1.0–2.5)
ALT: 45 U/L (ref 9–46)
AST: 33 U/L (ref 10–40)
Albumin: 4.6 g/dL (ref 3.6–5.1)
Alkaline phosphatase (APISO): 38 U/L (ref 36–130)
BUN: 16 mg/dL (ref 7–25)
CO2: 26 mmol/L (ref 20–32)
Calcium: 9.6 mg/dL (ref 8.6–10.3)
Chloride: 103 mmol/L (ref 98–110)
Creat: 1.11 mg/dL (ref 0.60–1.26)
Globulin: 2.8 g/dL (calc) (ref 1.9–3.7)
Glucose, Bld: 87 mg/dL (ref 65–99)
Potassium: 4.2 mmol/L (ref 3.5–5.3)
Sodium: 140 mmol/L (ref 135–146)
Total Bilirubin: 1.1 mg/dL (ref 0.2–1.2)
Total Protein: 7.4 g/dL (ref 6.1–8.1)
eGFR: 90 mL/min/{1.73_m2} (ref >=60)

## 2022-01-18 LAB — HEMOGLOBIN A1C
Hgb A1c MFr Bld: 5.1 % of total Hgb (ref <5.7)
Mean Plasma Glucose: 100 mg/dL
eAG (mmol/L): 5.5 mmol/L

## 2022-01-18 LAB — FSH/LH
FSH: 2.7 m[IU]/mL (ref 1.6–8.0)
LH: 4.1 m[IU]/mL (ref 1.5–9.3)

## 2022-01-18 LAB — TESTOSTERONE: Testosterone: 275 ng/dL (ref 250–827)

## 2022-01-18 NOTE — Addendum Note (Signed)
Addended bySamuel Bouche on: 01/18/2022 07:47 AM   Modules accepted: Orders

## 2022-04-25 ENCOUNTER — Ambulatory Visit (INDEPENDENT_AMBULATORY_CARE_PROVIDER_SITE_OTHER): Payer: Managed Care, Other (non HMO)

## 2022-04-25 ENCOUNTER — Ambulatory Visit: Payer: Managed Care, Other (non HMO) | Admitting: Sports Medicine

## 2022-04-25 DIAGNOSIS — M222X2 Patellofemoral disorders, left knee: Secondary | ICD-10-CM | POA: Diagnosis not present

## 2022-04-25 DIAGNOSIS — M25562 Pain in left knee: Secondary | ICD-10-CM

## 2022-04-25 DIAGNOSIS — Z09 Encounter for follow-up examination after completed treatment for conditions other than malignant neoplasm: Secondary | ICD-10-CM

## 2022-04-25 MED ORDER — MELOXICAM 15 MG PO TABS: ORAL_TABLET | ORAL | 3 refills | Status: DC

## 2022-04-25 NOTE — Assessment & Plan Note (Signed)
Pleasant 36 year old male, couple months of increasing pain anterior knee, laterally, left-sided. Exam is unremarkable with the exception of some patellar facet tenderness. We discussed the anatomy and pathophysiology, adding x-rays, meloxicam, formal PT per Return to see me in 6 weeks, MRI if no better

## 2022-04-25 NOTE — Progress Notes (Signed)
    Procedures performed today:    None.  Independent interpretation of notes and tests performed by another provider:   None.  Brief History, Exam, Impression, and Recommendations:    Patellofemoral syndrome, left Pleasant 36 year old male, couple months of increasing pain anterior knee, laterally, left-sided. Exam is unremarkable with the exception of some patellar facet tenderness. We discussed the anatomy and pathophysiology, adding x-rays, meloxicam, formal PT per Return to see me in 6 weeks, MRI if no better    ___________________________________________ Gwen Her. Dianah Field, M.D., ABFM., CAQSM. Primary Care and [place] Instructor of [place] of [facility] of Medicine

## 2022-05-12 ENCOUNTER — Ambulatory Visit: Payer: Managed Care, Other (non HMO) | Attending: Sports Medicine | Admitting: Physical Therapy

## 2022-05-12 DIAGNOSIS — M25562 Pain in left knee: Secondary | ICD-10-CM | POA: Insufficient documentation

## 2022-05-12 DIAGNOSIS — M6281 Muscle weakness (generalized): Secondary | ICD-10-CM | POA: Diagnosis present

## 2022-05-12 DIAGNOSIS — R2689 Other abnormalities of gait and mobility: Secondary | ICD-10-CM | POA: Insufficient documentation

## 2022-05-12 DIAGNOSIS — M222X2 Patellofemoral disorders, left knee: Secondary | ICD-10-CM | POA: Insufficient documentation

## 2022-05-17 ENCOUNTER — Ambulatory Visit: Payer: Managed Care, Other (non HMO) | Admitting: Physical Therapy

## 2022-05-17 DIAGNOSIS — M25562 Pain in left knee: Secondary | ICD-10-CM | POA: Diagnosis not present

## 2022-05-17 DIAGNOSIS — M6281 Muscle weakness (generalized): Secondary | ICD-10-CM

## 2022-05-17 DIAGNOSIS — R2689 Other abnormalities of gait and mobility: Secondary | ICD-10-CM

## 2022-05-17 NOTE — Therapy (Signed)
[address] [place], [place], [address] Phone: [phone]   Fax:  [phone]  Physical Therapy Treatment  Patient Details  Name: Barry Herrera MRN: [id number] Date of Birth: 1986-07-11 Referring Provider (PT): Silverio Decamp, MD  Rationale for Evaluation and Treatment Rehabilitation  Encounter Date: 05/17/2022   PT End of Session - 05/17/22 1018     Visit Number 2    Number of Visits 6    Date for PT Re-Evaluation 06/23/22    Authorization Type [employer]ign[employer]    PT Start Time 1018    PT Stop Time 1100    PT Time Calculation (min) 42 min    Activity Tolerance Patient tolerated treatment well    Behavior During Therapy [facility] for tasks assessed/performed             Past Medical History:  Diagnosis Date   History of acne     Past Surgical History:  Procedure Laterality Date   WISDOM TOOTH EXTRACTION      There were no vitals filed for this visit.   Subjective Assessment - 05/17/22 1021     Subjective Pt states he has been feeling more anterior knee pain at the end of the day in both knees. Pt has been doing the exercises with no issues.    How long can you sit comfortably? n/a    How long can you stand comfortably? n/a    How long can you walk comfortably? n/a    Patient Stated Goals Improve pain for return to normal activities    Currently in Pain? No/denies                [facility] PT Assessment - 05/17/22 0001       Assessment   Medical Diagnosis M22.2X2 (ICD-10-CM) - Patellofemoral syndrome, left    Referring Provider (PT) Silverio Decamp, MD                           [facility] Adult PT Treatment/Exercise - 05/17/22 0001       Exercises   Exercises Knee/Hip      Knee/Hip Exercises: Stretches   Passive Hamstring Stretch Right;Left;2 reps;30 seconds    Passive Hamstring Stretch Limitations supine with strap    Quad Stretch Right;Left;2 reps;30 seconds     Quad Stretch Limitations supine with strap    Other Knee/Hip Stretches figure 4 stretch sitting 2x30 sec      Knee/Hip Exercises: Standing   Step Down Right;Left;2 sets;10 reps;Hand Hold: 2;Step Height: 6"    SLS with Vectors Forward T 2x10 R & L                          PT Long Term Goals - 05/12/22 1217       PT LONG TERM GOAL #1   Title Pt will be independent with HEP    Time 6    Period Weeks    Status New    Target Date 06/23/22      PT LONG TERM GOAL #2   Title Pt will have full return to normal activities and squatting without pain    Time 6    Period Weeks    Status New    Target Date 06/23/22      PT LONG TERM GOAL #3   Title Pt will have FOTO score of at least 84  Time 6    Period Weeks    Status New    Target Date 06/23/22      PT LONG TERM GOAL #4   Title Pt will be able to perform all lifts with good body mechanics to prevent further injury    Time 6    Period Weeks    Status New    Target Date 06/23/22                   Plan - 05/17/22 1053     Clinical Impression Statement Treatment focused on progressing pt's stabilization exercises. Having bilat anterior knee pain -- appears consistent with tendonitis. Discussed stretching and icing tendon as needed. Worked on initiating eccentric quad strengthening.    Personal Factors and Comorbidities Age;Fitness    Examination-Activity Limitations Biomedical engineer;Yard Work;Other   Golf   Stability/Clinical Decision Making Stable/Uncomplicated    Rehab Potential Good    PT Frequency 1x / week    PT Duration 6 weeks    PT Treatment/Interventions ADLs/Self Care Home Management;Aquatic Therapy;Electrical Stimulation;Cryotherapy;Iontophoresis '4mg'$ /ml Dexamethasone;DME Instruction;Gait training;Stair training;Functional mobility training;Therapeutic activities;Balance training;Therapeutic exercise;Patient/family  education;Manual techniques;Dry needling;Taping;Vasopneumatic Device;Passive range of motion    PT Next Visit Plan Assess response to HEP. Progress return to squatting as tolerated. Continue strengthening glute med and knee stabilizers    PT Home Exercise Plan Access Code [email or web address]    Consulted and Agree with Plan of Care Patient             Patient will benefit from skilled therapeutic intervention in order to improve the following deficits and impairments:  Increased fascial restricitons, Decreased coordination, Increased muscle spasms, Pain, Impaired flexibility, Decreased mobility, Decreased strength  Visit Diagnosis: Acute pain of left knee  Muscle weakness (generalized)  Other abnormalities of gait and mobility     Problem List Patient Active Problem List   Diagnosis Date Noted   Patellofemoral syndrome, left 04/25/2022   Papule right upper arm 12/01/2021   Mixed hyperlipidemia 09/15/2020   Elevated fasting glucose 09/15/2020   Encounter for annual physical exam 08/27/2020   Plantar wart of left foot 08/27/2020   Pain in both wrists 08/27/2020   BMI 31.0-31.9,adult 08/27/2020   Left leg pain 03/12/2020   Injury of right third toe 03/12/2020   External hemorrhoid 12/05/2019   Left ankle swelling 11/07/2019    Jeriann Sayres April Beatrix Shipper [place], PT 05/17/2022, 11:06 AM  [facility] [place] [place], [place], [address] Phone: [phone]   Fax:  [phone]  Name: Jj Enyeart MRN: [id number] Date of Birth: [birth year]

## 2022-05-30 ENCOUNTER — Ambulatory Visit: Payer: Managed Care, Other (non HMO) | Attending: Sports Medicine | Admitting: Physical Therapy

## 2022-05-30 DIAGNOSIS — R2689 Other abnormalities of gait and mobility: Secondary | ICD-10-CM | POA: Diagnosis present

## 2022-05-30 DIAGNOSIS — M25562 Pain in left knee: Secondary | ICD-10-CM | POA: Diagnosis not present

## 2022-05-30 DIAGNOSIS — M6281 Muscle weakness (generalized): Secondary | ICD-10-CM | POA: Diagnosis present

## 2022-05-30 NOTE — Therapy (Signed)
Barry Herrera, [place], [address] Phone: [phone]   Fax:  [phone]  Physical Therapy Treatment  Patient Details  Name: Barry Herrera MRN: [id number] Date of Birth: 06/25/1986 Referring Provider (PT): Silverio Decamp, MD   Encounter Date: 05/30/2022   PT End of Session - 05/30/22 1148     Visit Number 3    Number of Visits 6    Date for PT Re-Evaluation 06/23/22    Authorization Type Cigna    PT Start Time 1148    PT Stop Time 1230    PT Time Calculation (min) 42 min    Activity Tolerance Patient tolerated treatment well    Behavior During Therapy [facility] for tasks assessed/performed             Past Medical History:  Diagnosis Date   History of acne     Past Surgical History:  Procedure Laterality Date   WISDOM TOOTH EXTRACTION      There were no vitals filed for this visit.   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 Has not been as consistent with this exercises. Knees have been good - not as much pain anymore. Pt can kind of squat now. Can tell that band exercises are getting easier.    How long can you sit comfortably? n/a    How long can you stand comfortably? n/a    How long can you walk comfortably? n/a    Patient Stated Goals Improve pain for return to normal activities    Currently in Pain? No/denies                [facility] PT Assessment - 05/30/22 0001       Assessment   Medical Diagnosis M22.2X2 (ICD-10-CM) - Patellofemoral syndrome, left    Referring Provider (PT) Silverio Decamp, MD                           [facility] Adult PT Treatment/Exercise - 05/30/22 0001       Knee/Hip Exercises: Stretches   Piriformis Stretch Right;Left;30 seconds      Knee/Hip Exercises: Aerobic   Recumbent Bike L1 x 5 min      Knee/Hip Exercises: Standing   Step Down Right;Left;2  sets;10 reps;Step Height: 6";Hand Hold: 0    SLS with Vectors Forward T 3x10 R & L   with 10# KB   Other Standing Knee Exercises Squat touch (~18") 3x10 15# KB      Knee/Hip Exercises: Supine   Other Supine Knee/Hip Exercises Supine Hip Extension with March on Bench 3x10                          PT Long Term Goals - 05/12/22 1217       PT LONG TERM GOAL #1   Title Pt will be independent with HEP    Time 6    Period Weeks    Status New    Target Date 06/23/22      PT LONG TERM GOAL #2   Title Pt will have full return to normal activities and squatting without pain    Time 6    Period Weeks    Status New    Target Date 06/23/22      PT LONG TERM GOAL #3   Title  Pt will have FOTO score of at least 84    Time 6    Period Weeks    Status New    Target Date 06/23/22      PT LONG TERM GOAL #4   Title Pt will be able to perform all lifts with good body mechanics to prevent further injury    Time 6    Period Weeks    Status New    Target Date 06/23/22                   Plan - 05/30/22 1248     Clinical Impression Statement Pt continues to progress well. Improving L LE stability. No anterior knee pain noted. Progressed exercises as tolerated.    Personal Factors and Comorbidities Age;Fitness    Examination-Activity Limitations Biomedical engineer;Yard Work;Other   Golf   Stability/Clinical Decision Making Stable/Uncomplicated    Rehab Potential Good    PT Frequency 1x / week    PT Duration 6 weeks    PT Treatment/Interventions ADLs/Self Care Home Management;Aquatic Therapy;Electrical Stimulation;Cryotherapy;Iontophoresis '4mg'$ /ml Dexamethasone;DME Instruction;Gait training;Stair training;Functional mobility training;Therapeutic activities;Balance training;Therapeutic exercise;Patient/family education;Manual techniques;Dry needling;Taping;Vasopneumatic Device;Passive range of motion    PT  Next Visit Plan Assess response to HEP. Progress return to squatting as tolerated. Continue strengthening glute med and knee stabilizers    PT Home Exercise Plan Access Code [email or web address]    Consulted and Agree with Plan of Care Patient             Patient will benefit from skilled therapeutic intervention in order to improve the following deficits and impairments:  Increased fascial restricitons, Decreased coordination, Increased muscle spasms, Pain, Impaired flexibility, Decreased mobility, Decreased strength  Visit Diagnosis: Acute pain of left knee  Muscle weakness (generalized)  Other abnormalities of gait and mobility     Problem List Patient Active Problem List   Diagnosis Date Noted   Patellofemoral syndrome, left 04/25/2022   Papule right upper arm 12/01/2021   Mixed hyperlipidemia 09/15/2020   Elevated fasting glucose 09/15/2020   Encounter for annual physical exam 08/27/2020   Plantar wart of left foot 08/27/2020   Pain in both wrists 08/27/2020   BMI 31.0-31.9,adult 08/27/2020   Left leg pain 03/12/2020   Injury of right third toe 03/12/2020   External hemorrhoid 12/05/2019   Left ankle swelling 11/07/2019    [facility] April Gordy Levan, PT, DPT 05/30/2022, 12:50 PM  [facility] [place] [address] [place], [place], [address] Phone: [phone]   Fax:  [phone]  Name: Barry Herrera MRN: [id number] Date of Birth: 1985-11-27

## 2022-06-06 ENCOUNTER — Ambulatory Visit: Payer: Managed Care, Other (non HMO) | Admitting: Sports Medicine

## 2022-06-06 DIAGNOSIS — M222X2 Patellofemoral disorders, left knee: Secondary | ICD-10-CM

## 2022-06-06 NOTE — Progress Notes (Signed)
    Procedures performed today:    None.  Independent interpretation of notes and tests performed by another provider:   None.  Brief History, Exam, Impression, and Recommendations:    Patellofemoral syndrome, left This is a very pleasant 36 year old male, he has had a couple of months of increasing pain anterior knee, left-sided, lateral, he did have some patellar facet tenderness at the last visit. We diagnosed him with patellofemoral syndrome, added formal physical therapy, he is probably done 4 to 5 weeks. He is 75% better, and continues to improve. Exam is benign today with the exception of very mildly painful patellar compression. Continue PT for another 4 to 6 weeks and return to see me if not better at which point we will probably get an MRI to make sure that I am not missing something.    ____________________________________________ Gwen Her. Dianah Field, M.D., ABFM., CAQSM., AME. Primary Care and Sports Medicine [facility]  Adjunct Professor of [place] of [facility] of Medicine  Risk manager

## 2022-06-06 NOTE — Assessment & Plan Note (Signed)
This is a very pleasant 36 year old male, he has had a couple of months of increasing pain anterior knee, left-sided, lateral, he did have some patellar facet tenderness at the last visit. We diagnosed him with patellofemoral syndrome, added formal physical therapy, he is probably done 4 to 5 weeks. He is 75% better, and continues to improve. Exam is benign today with the exception of very mildly painful patellar compression. Continue PT for another 4 to 6 weeks and return to see me if not better at which point we will probably get an MRI to make sure that I am not missing something.

## 2022-06-14 ENCOUNTER — Ambulatory Visit: Payer: Managed Care, Other (non HMO) | Admitting: Physical Therapy

## 2022-06-14 ENCOUNTER — Encounter: Payer: Self-pay | Admitting: Physical Therapy

## 2022-06-14 DIAGNOSIS — M25562 Pain in left knee: Secondary | ICD-10-CM

## 2022-06-14 DIAGNOSIS — M6281 Muscle weakness (generalized): Secondary | ICD-10-CM

## 2022-06-14 DIAGNOSIS — R2689 Other abnormalities of gait and mobility: Secondary | ICD-10-CM

## 2022-06-14 NOTE — Therapy (Signed)
[place], [place], [address] Phone: [phone]   Fax:  [phone]  Physical Therapy Treatment  Patient Details  Name: Kyrin Gratz MRN: [id number] Date of Birth: 1986/02/02 Referring Provider (PT): Silverio Decamp, MD   Encounter Date: 06/14/2022   PT End of Session - 06/14/22 1151     Visit Number 4    Number of Visits 6    Date for PT Re-Evaluation 06/23/22    Authorization Type [employer]    PT Start Time 1151    PT Stop Time 1230    PT Time Calculation (min) 39 min    Activity Tolerance Patient tolerated treatment well    Behavior During Therapy [facility] for tasks assessed/performed             Past Medical History:  Diagnosis Date   History of acne     Past Surgical History:  Procedure Laterality Date   WISDOM TOOTH EXTRACTION      There were no vitals filed for this visit.   Subjective Assessment - 06/14/22 1154     Subjective Pt reports that he still can't bend his knee past 90 during squats at the gym without pain. Has been able to return to the gym. He can tell that his knee is getting better though. Pt notes some knee pain when laying on his stomach sleeping.    How long can you sit comfortably? n/a    How long can you stand comfortably? n/a    How long can you walk comfortably? n/a    Patient Stated Goals Improve pain for return to normal activities    Currently in Pain? No/denies                [facility] PT Assessment - 06/14/22 0001       Assessment   Medical Diagnosis M22.2X2 (ICD-10-CM) - Patellofemoral syndrome, left    Referring Provider (PT) Silverio Decamp, MD                           [facility] Adult PT Treatment/Exercise - 06/14/22 0001       Knee/Hip Exercises: Standing   Other Standing Knee Exercises Deep squat touch with pole support 3x5x5 sec hold      Knee/Hip Exercises: Supine   Straight Leg Raises  Strengthening;Left;3 sets;10 reps    Straight Leg Raises Limitations with IR      Knee/Hip Exercises: Sidelying   Clams blue tband 3x10    Other Sidelying Knee/Hip Exercises Hip abd + flex and then ext blue tband 3x10                          PT Long Term Goals - 06/14/22 1240       PT LONG TERM GOAL #1   Title Pt will be independent with HEP    Time 6    Period Weeks    Status On-going    Target Date 06/23/22      PT LONG TERM GOAL #2   Title Pt will have full return to normal activities and squatting without pain    Time 6    Period Weeks    Status On-going    Target Date 06/23/22      PT LONG TERM GOAL #3   Title Pt will have FOTO score of at least 84    Baseline 87 on 06/14/22  Time 6    Period Weeks    Status Achieved    Target Date 06/23/22      PT LONG TERM GOAL #4   Title Pt will be able to perform all lifts with good body mechanics to prevent further injury    Time 6    Period Weeks    Status On-going    Target Date 06/23/22                   Plan - 06/14/22 1241     Clinical Impression Statement Pt is demonstrating good progress. Some continued pain with deep squats on posterolateral L knee -- improves after warm up. Continued L>R patellar hypermobility. Pt most notices with medial translation of patella when turning in bed. Initiated vastus lateralis strengthening and progressing ITB/hip strengthening this session. Consider trial of taping next session.    Personal Factors and Comorbidities Age;Fitness    Examination-Activity Limitations Biomedical engineer;Yard Work;Other   Golf   Stability/Clinical Decision Making Stable/Uncomplicated    Rehab Potential Good    PT Frequency 1x / week    PT Duration 6 weeks    PT Treatment/Interventions ADLs/Self Care Home Management;Aquatic Therapy;Electrical Stimulation;Cryotherapy;Iontophoresis '4mg'$ /ml Dexamethasone;DME  Instruction;Gait training;Stair training;Functional mobility training;Therapeutic activities;Balance training;Therapeutic exercise;Patient/family education;Manual techniques;Dry needling;Taping;Vasopneumatic Device;Passive range of motion    PT Next Visit Plan Assess response to HEP. Progress return to squatting as tolerated. Continue strengthening glute med and knee stabilizers    PT Home Exercise Plan Access Code [email or web address]    Consulted and Agree with Plan of Care Patient             Patient will benefit from skilled therapeutic intervention in order to improve the following deficits and impairments:  Increased fascial restricitons, Decreased coordination, Increased muscle spasms, Pain, Impaired flexibility, Decreased mobility, Decreased strength  Visit Diagnosis: Acute pain of left knee  Muscle weakness (generalized)  Other abnormalities of gait and mobility     Problem List Patient Active Problem List   Diagnosis Date Noted   Patellofemoral syndrome, left 04/25/2022   Papule right upper arm 12/01/2021   Mixed hyperlipidemia 09/15/2020   Elevated fasting glucose 09/15/2020   Encounter for annual physical exam 08/27/2020   Plantar wart of left foot 08/27/2020   Pain in both wrists 08/27/2020   BMI 31.0-31.9,adult 08/27/2020   Left leg pain 03/12/2020   Injury of right third toe 03/12/2020   External hemorrhoid 12/05/2019   Left ankle swelling 11/07/2019    [facility] April Gordy Levan, [place], DPT 06/14/2022, 12:43 PM  [facility] [place], [place], [address] Phone: [phone]   Fax:  [phone]  Name: Alper Guilmette MRN: [id number] Date of Birth: December 11, 1985

## 2022-06-21 ENCOUNTER — Ambulatory Visit: Payer: Managed Care, Other (non HMO) | Attending: Sports Medicine | Admitting: Physical Therapy

## 2022-06-21 ENCOUNTER — Encounter: Payer: Self-pay | Admitting: Physical Therapy

## 2022-06-21 DIAGNOSIS — M6281 Muscle weakness (generalized): Secondary | ICD-10-CM | POA: Diagnosis present

## 2022-06-21 DIAGNOSIS — M25562 Pain in left knee: Secondary | ICD-10-CM | POA: Insufficient documentation

## 2022-06-21 DIAGNOSIS — R2689 Other abnormalities of gait and mobility: Secondary | ICD-10-CM | POA: Insufficient documentation

## 2022-06-21 NOTE — Therapy (Addendum)
[place], [place], [address] Phone: [phone]   Fax:  [phone]  Physical Therapy Treatment and Discharge  Patient Details  Name: Barry Herrera MRN: [id number] Date of Birth: [birth year] Referring Provider (PT): Silverio Decamp, MD  PHYSICAL THERAPY DISCHARGE SUMMARY  Visits from Start of Care: 5  Current functional level related to goals / functional outcomes: See below   Remaining deficits: See below   Education / Equipment: Continued exercises and progression to blue and then black tband    Patient agrees to discharge. Patient goals were met. Patient is being discharged due to meeting the stated rehab goals.   Encounter Date: 06/21/2022   PT End of Session - 06/21/22 1102     Visit Number 5    Number of Visits 6    Date for PT Re-Evaluation 06/23/22    Authorization Type Cigna    PT Start Time 1102    PT Stop Time 1128    PT Time Calculation (min) 26 min    Activity Tolerance Patient tolerated treatment well    Behavior During Therapy WFL for tasks assessed/performed             Past Medical History:  Diagnosis Date   History of acne     Past Surgical History:  Procedure Laterality Date   WISDOM TOOTH EXTRACTION      There were no vitals filed for this visit.   Subjective Assessment - 06/21/22 1105     Subjective Pt states he played golf yesterday and bent down to get the ball and didn't hurt. Feels much better. Has not yet returned to the gym since last week.    How long can you sit comfortably? n/a    How long can you stand comfortably? n/a    How long can you walk comfortably? n/a    Patient Stated Goals Improve pain for return to normal activities    Currently in Pain? No/denies                [facility] PT Assessment - 06/21/22 0001       Assessment   Medical Diagnosis M22.2X2 (ICD-10-CM) - Patellofemoral syndrome, left    Referring Provider  (PT) Silverio Decamp, MD      Squat   Comments Mild pain in L posterolateral knee with deep squat      Single Leg Squat   Comments From table able to perform without pain      Strength   Right Hip Extension 5/5    Right Hip ABduction 5/5    Left Hip Extension 4+/5    Left Hip ABduction 5/5      Flexibility   Hamstrings 80 deg bilat                           [facility] Adult PT Treatment/Exercise - 06/21/22 0001       Knee/Hip Exercises: Stretches   Gastroc Stretch Right;Left;30 seconds;2 reps    Soleus Stretch Right;Left;30 seconds      Knee/Hip Exercises: Standing   Heel Raises Both;20 reps    Heel Raises Limitations toes in    Other Standing Knee Exercises Deep squat x5                          PT Long Term Goals - 06/21/22 1105       PT LONG TERM  GOAL #1   Title Pt will be independent with HEP    Time 6    Period Weeks    Status Achieved    Target Date 06/23/22      PT LONG TERM GOAL #2   Title Pt will have full return to normal activities and squatting without pain    Time 6    Period Weeks    Status Achieved    Target Date 06/23/22      PT LONG TERM GOAL #3   Title Pt will have FOTO score of at least 84    Baseline 87 on 06/14/22    Time 6    Period Weeks    Status Achieved    Target Date 06/23/22      PT LONG TERM GOAL #4   Title Pt will be able to perform all lifts with good body mechanics to prevent further injury    Time 6    Period Weeks    Status Achieved    Target Date 06/23/22                   Plan - 06/21/22 1126     Clinical Impression Statement Pt reports no pain with squatting during golf. Feels most of his pain has resolved. Some mild pain with full deep squat but pt reports it is manageable. Discussed addition of gastroc/soleus stretching and strengthening as this may be a factor for his posterolateral knee pain. Discussed calling if any new issues arise. Otherwise, pt is ready for PT  d/c -- has all exercises needed and has met all of his LTGs.    Personal Factors and Comorbidities Age;Fitness    Examination-Activity Limitations Biomedical engineer;Yard Work;Other   Golf   Stability/Clinical Decision Making Stable/Uncomplicated    Rehab Potential Good    PT Frequency 1x / week    PT Duration 6 weeks    PT Treatment/Interventions ADLs/Self Care Home Management;Aquatic Therapy;Electrical Stimulation;Cryotherapy;Iontophoresis 52m/ml Dexamethasone;DME Instruction;Gait training;Stair training;Functional mobility training;Therapeutic activities;Balance training;Therapeutic exercise;Patient/family education;Manual techniques;Dry needling;Taping;Vasopneumatic Device;Passive range of motion    PT Next Visit Plan Assess response to HEP. Progress return to squatting as tolerated. Continue strengthening glute med and knee stabilizers    PT Home Exercise Plan Access Code [email or web address]    Consulted and Agree with Plan of Care Patient             Patient will benefit from skilled therapeutic intervention in order to improve the following deficits and impairments:  Increased fascial restricitons, Decreased coordination, Increased muscle spasms, Pain, Impaired flexibility, Decreased mobility, Decreased strength  Visit Diagnosis: Acute pain of left knee  Muscle weakness (generalized)  Other abnormalities of gait and mobility     Problem List Patient Active Problem List   Diagnosis Date Noted   Patellofemoral syndrome, left 04/25/2022   Papule right upper arm 12/01/2021   Mixed hyperlipidemia 09/15/2020   Elevated fasting glucose 09/15/2020   Encounter for annual physical exam 08/27/2020   Plantar wart of left foot 08/27/2020   Pain in both wrists 08/27/2020   BMI 31.0-31.9,adult 08/27/2020   Left leg pain 03/12/2020   Injury of right third toe 03/12/2020   External hemorrhoid 12/05/2019   Left ankle swelling  11/07/2019    GMichael [facility] Deb[facility] Va [facility] CenterApril MGordy Levan PV[place] DPT 06/21/2022, 11:32 AM  [facility] [place] [address]Phone: [phone]  Fax:  [phone] Name: MSophia Cubero  MRN: [id number] Date of Birth: 1986-10-20

## 2022-11-20 IMAGING — DX DG KNEE COMPLETE 4+V*L*
5 series · 5 of 5 positions shown · non-contrast
Comparison: None Available.

CLINICAL DATA: Knee pain

EXAM:
LEFT KNEE - COMPLETE 4+ VIEW

[knee lat (1 of 2)]
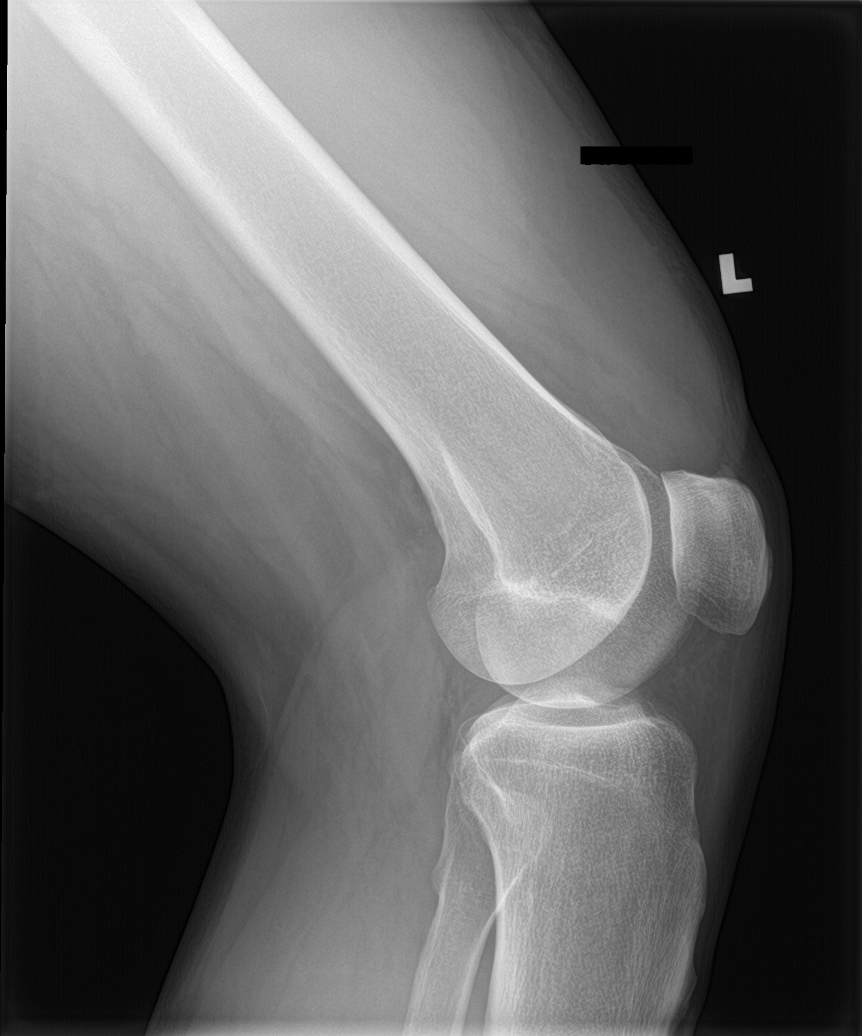

[knee ap bilat standing]
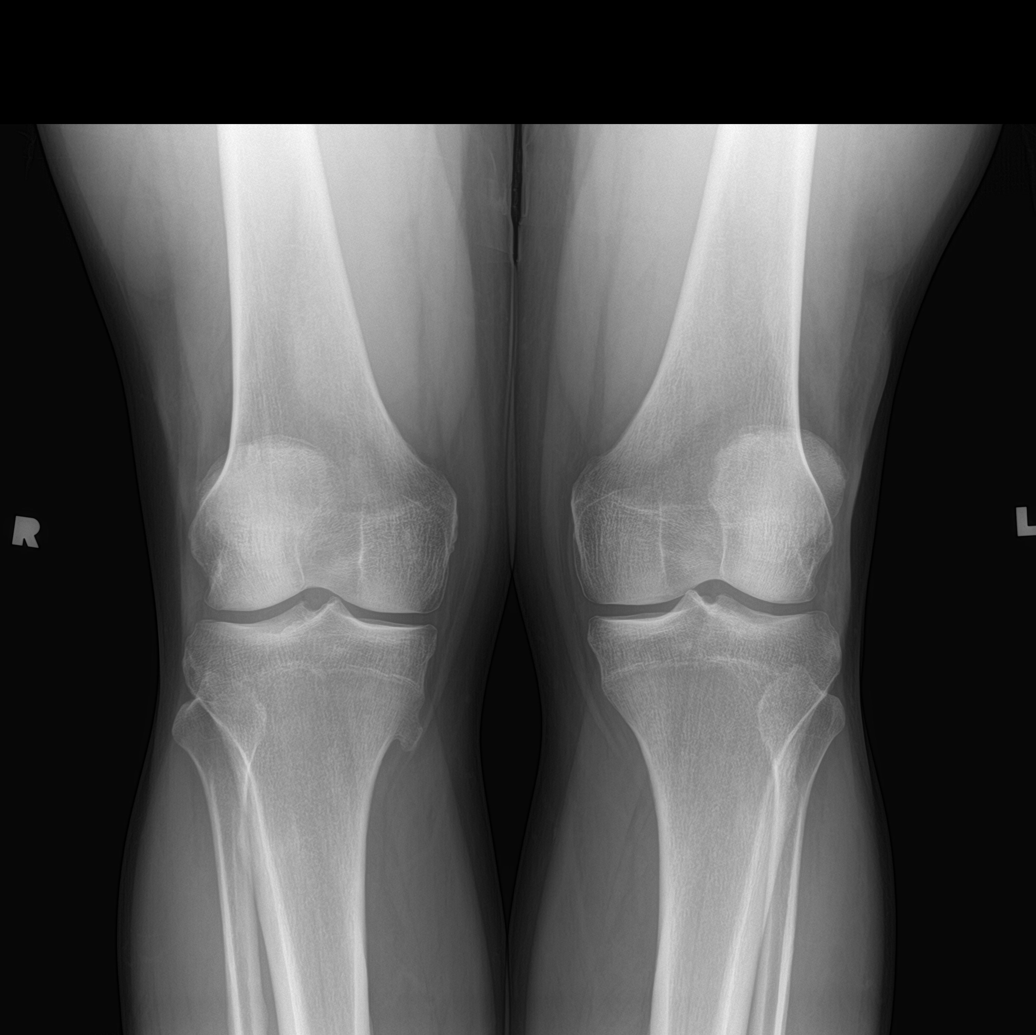

[knee sunrise standing]
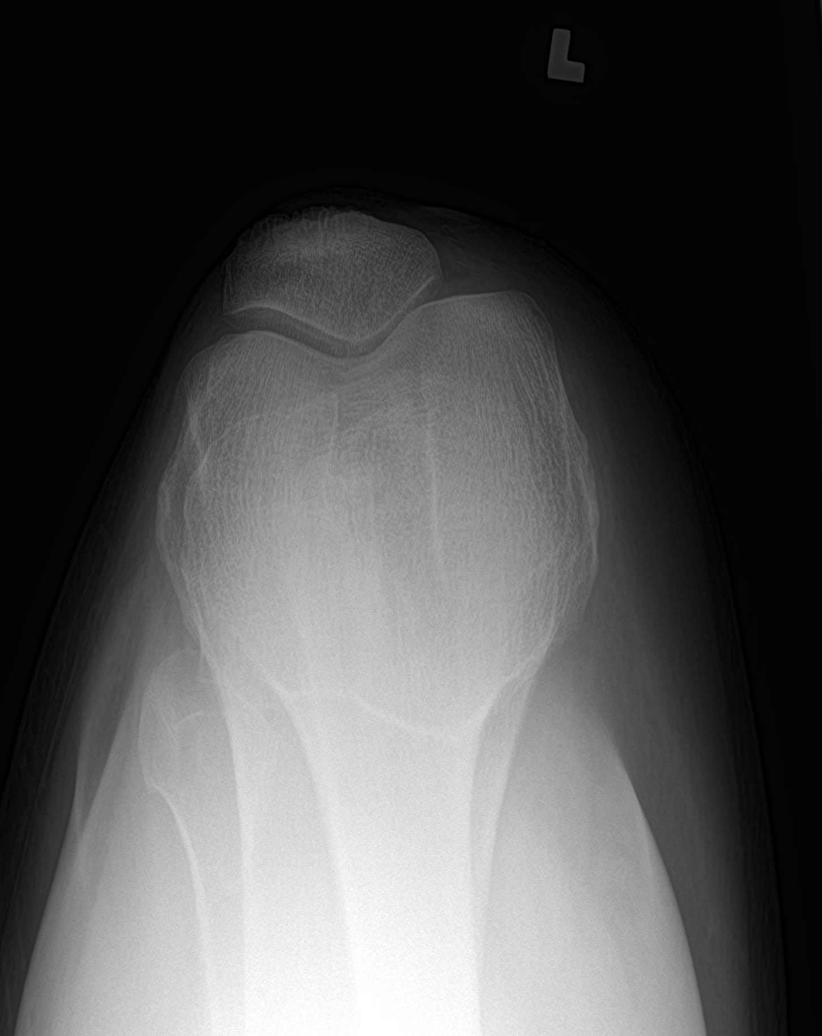

[knee tunnel]
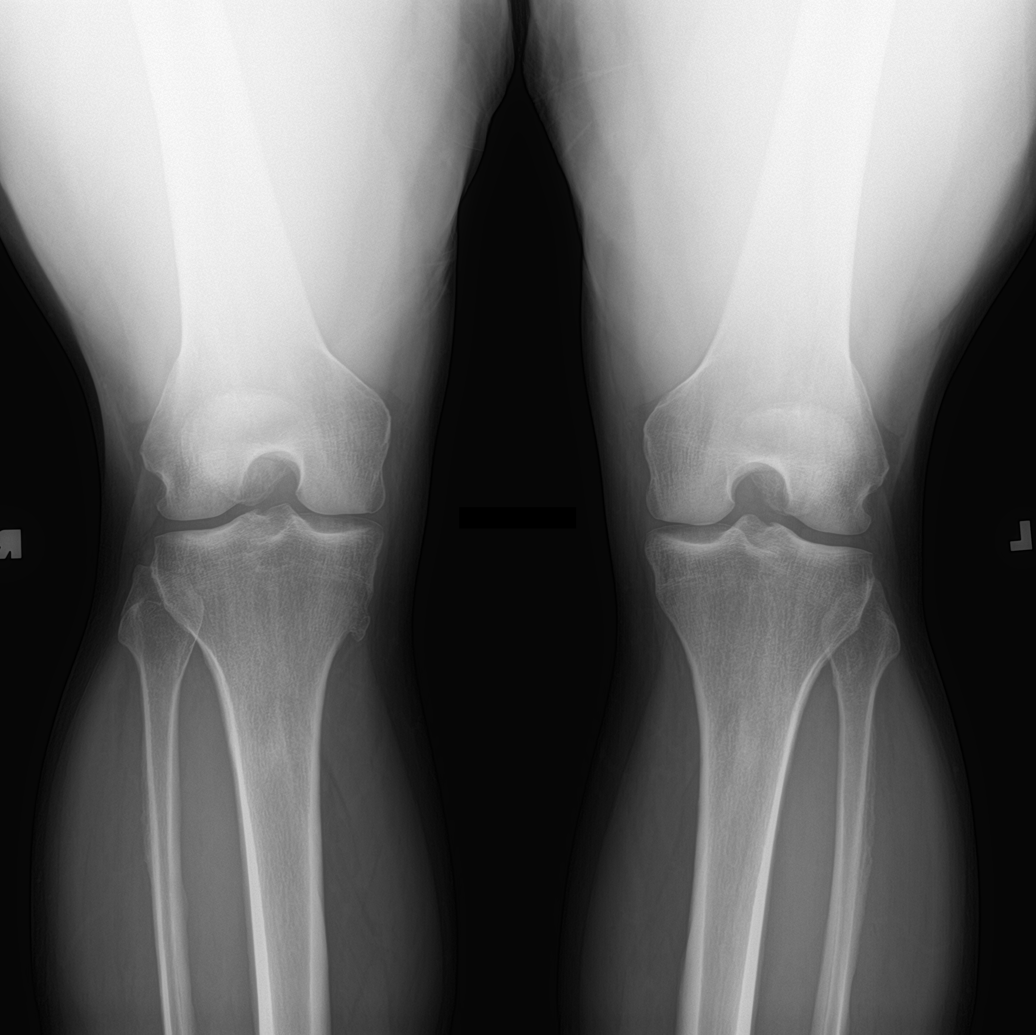

[knee lat (2 of 2)]
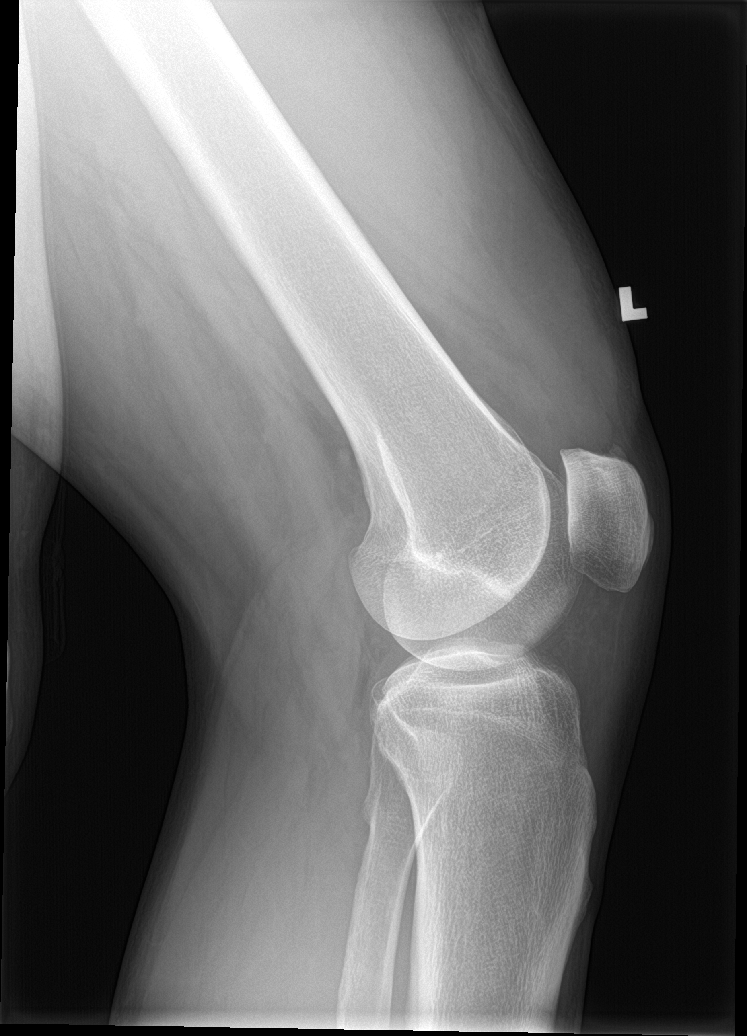

[5 of 5 positions shown; findings below may reference images not displayed]

FINDINGS: No acute fracture or dislocation is noted. No soft tissue
abnormality is seen. No joint effusion is noted.
IMPRESSION: No acute abnormality noted.

## 2022-12-25 ENCOUNTER — Ambulatory Visit: Payer: Managed Care, Other (non HMO) | Admitting: Sports Medicine

## 2022-12-25 DIAGNOSIS — R238 Other skin changes: Secondary | ICD-10-CM

## 2022-12-25 DIAGNOSIS — H109 Unspecified conjunctivitis: Secondary | ICD-10-CM | POA: Diagnosis not present

## 2022-12-25 MED ORDER — ERYTHROMYCIN 5 MG/GM OP OINT: 1.0000 | TOPICAL_OINTMENT | Freq: Three times a day (TID) | OPHTHALMIC | 0 refills | Status: AC

## 2022-12-25 NOTE — Progress Notes (Signed)
    Procedures performed today:    None.  Independent interpretation of notes and tests performed by another provider:   None.  Brief History, Exam, Impression, and Recommendations:    Papule right upper arm Barry Herrera is a pleasant 37 year old male he has what appears to be a dermatofibroma right upper arm. The plan was to consider intralesional injection before surgical excision, he would prefer that we simply do a full surgical excision, I will bring him back in a 30-minute slot.  Conjunctivitis, left eye Barry Herrera has also had discomfort, swelling of the left eye, occasional blurred vision that he can blink through, he wakes up with crust along the eye. On exam he has minimal conjunctival injection, slight lower eyelid swelling, no proptosis, no hyphema, no anterior chamber chemosis. Extraocular muscles are intact and vision grossly intact. I do suspect this is conjunctivitis, adding erythromycin ointment, return to see me as needed for this.    ____________________________________________ Gwen Her. Dianah Field, M.D., ABFM., CAQSM., AME. Primary Care and Sports Medicine [place]  Adjunct Professor of [place] [employer] [facility] of Medicine  Risk manager

## 2022-12-25 NOTE — Assessment & Plan Note (Signed)
Barry Herrera is a pleasant 37 year old male he has what appears to be a dermatofibroma right upper arm. The plan was to consider intralesional injection before surgical excision, he would prefer that we simply do a full surgical excision, I will bring him back in a 30-minute slot.

## 2022-12-25 NOTE — Assessment & Plan Note (Signed)
Barry Herrera has also had discomfort, swelling of the left eye, occasional blurred vision that he can blink through, he wakes up with crust along the eye. On exam he has minimal conjunctival injection, slight lower eyelid swelling, no proptosis, no hyphema, no anterior chamber chemosis. Extraocular muscles are intact and vision grossly intact. I do suspect this is conjunctivitis, adding erythromycin ointment, return to see me as needed for this.

## 2023-01-04 ENCOUNTER — Ambulatory Visit: Payer: Managed Care, Other (non HMO) | Admitting: Sports Medicine

## 2023-01-04 ENCOUNTER — Encounter: Payer: Self-pay | Admitting: Sports Medicine

## 2023-01-04 VITALS — BP 142/94 | HR 91

## 2023-01-04 DIAGNOSIS — R238 Other skin changes: Secondary | ICD-10-CM

## 2023-01-04 MED ORDER — HYDROCODONE-ACETAMINOPHEN 5-325 MG PO TABS: 1.0000 | ORAL_TABLET | Freq: Three times a day (TID) | ORAL | 0 refills | Status: DC | PRN

## 2023-01-04 NOTE — Assessment & Plan Note (Signed)
Surgical excision of a right upper arm papule, question dermatofibroma, this will be sent off to pathology, return to see me in 2 weeks for wound check. Hydrocodone for postoperative pain.

## 2023-01-04 NOTE — Patient Instructions (Signed)
Incision Care, Adult An incision is a surgical cut that is made through your skin. Most incisions are closed after a surgical procedure. Your incision may be closed with stitches (sutures), staples, skin glue, or adhesive strips. You may need to return to your health care provider to have sutures or staples removed. This may occur several days or several weeks after your surgery. Until then, the incision needs to be cared for properly to prevent infection. Follow instructions from your health care provider about how to care for your incision. Supplies needed: Soap, water, and a clean hand towel. Wound cleanser. A clean bandage (dressing), if needed. Cream or ointment, if told by your health care provider. Clean gauze. How to care for your incision Cleaning the incision Ask your health care provider how to clean the incision. This may include: Wearing medical gloves. Using mild soap and water, or wound cleanser. Using a clean gauze to pat the incision dry after cleaning it. Dressing changes Wash your hands with soap and water for at least 20 seconds before and after you change the dressing. If soap and water are not available, use hand sanitizer. Do not use disinfectants or antiseptics, such as rubbing alcohol, to clean open wounds unless told by your health care provider. Change your dressing as told by your health care provider. Leave sutures, staples, skin glue, or adhesive strips in place. These skin closures may need to stay in place for 2 weeks or longer. If adhesive strip edges start to loosen and curl up, you may trim the loose edges. Do not remove adhesive strips completely unless your health care provider tells you to do that. Apply cream or ointment. Do this only as told by your health care provider. Cover the incision with a clean dressing. Ask your health care provider when you can start to leave the incision uncovered. Checking for infection Check your incision area every day for  signs of infection. Check for: More redness, swelling, or pain. More fluid or blood. New warmth, hardness, or a rash that develops along the incision. Pus or a bad smell.  Follow these instructions at home Medicines Take over-the-counter and prescription medicines only as told by your health care provider. If you were prescribed an antibiotic medicine, cream, or ointment, take or apply it as told by your health care provider. Do not stop using the antibiotic even if your condition improves. Eating and drinking Eat a diet that includes protein, vitamin A, vitamin C, and other nutrient-rich foods to help the wound heal. Foods rich in protein include meat, fish, eggs, dairy, beans, nuts, and protein supplement drinks. Foods rich in vitamin A include carrots and dark green, leafy vegetables. Foods rich in vitamin C include citrus fruits, tomatoes, broccoli, and peppers. Drink enough fluid to keep your urine pale yellow. General instructions  Do not take baths, swim, use a hot tub, or do anything that would put the incision underwater until your health care provider approves. Ask your health care provider if you may take showers. You may only be allowed to take sponge baths. Limit movement around your incision to promote healing. Avoid straining, lifting, or exercising for the first 2 weeks after your procedure, or for as long as told by your health care provider. Return to your normal activities as told by your health care provider. Ask your health care provider what activities are safe for you. Do not scratch, pick, or scrub the incision. Keep it covered as told by your health care provider.   Protect your incision from the sun when you are outside for the first 6 months, or for as long as told by your health care provider. Cover up the scar area or apply sunscreen that has an SPF of at least 30. Do not use any products that contain nicotine or tobacco, such as cigarettes, e-cigarettes, and  chewing tobacco. These can delay incision healing. If you need help quitting, ask your health care provider. Keep all follow-up visits. This is important. Contact a health care provider if: You have any of these signs of infection: More redness, swelling, or pain around your incision. More fluid or blood coming from your incision. New warmth or hardness around your incision. Pus or a bad smell coming from your incision. A rash that develops along the incision. You feel nauseous or you vomit. You are dizzy. Your sutures, staples, skin glue, or adhesive strips come undone. Your wound gets bigger. You have a fever. Get help right away if: Your incision bleeds through the dressing and the bleeding does not stop with gentle pressure. The edges of your incision open up and separate. These symptoms may represent a serious problem that is an emergency. Do not wait to see if the symptoms will go away. Get medical help right away. Call your local emergency services (911 in the [place] Do not drive yourself to the hospital. Summary Follow instructions from your health care provider about how to care for your incision. Wash your hands with soap and water for at least 20 seconds before and after you change the dressing. If soap and water are not available, use hand sanitizer. Check your incision area every day for signs of infection. Keep all follow-up visits. This is important. This information is not intended to replace advice given to you by your health care provider. Make sure you discuss any questions you have with your health care provider. Document Revised: 02/07/2021 Document Reviewed: 02/07/2021 Elsevier Patient Education  2023 Elsevier Inc.  

## 2023-01-04 NOTE — Progress Notes (Signed)
    Procedures performed today:    Procedure: Surgical excision of right upper arm 2 cm papular lesion Risks, benefits, and alternatives explained and consent obtained. Time out conducted. Surface prepped with alcohol. 5cc lidocaine with epinephine infiltrated in a field block. Adequate anesthesia ensured. Area prepped and draped in a sterile fashion. Excision performed with: Using a #10 blade and made elliptical incision down to the subcutaneous tissue leaving about a 1 to 2 mm margin around the lesion, the lesion was removed en bloc, and sent off to dermatopathology.  I then closed the incision with interrupted 3-0 Ethilon sutures x 7  hemostasis achieved. Pt stable.  Independent interpretation of notes and tests performed by another provider:   None.  Brief History, Exam, Impression, and Recommendations:    Papule right upper arm Surgical excision of a right upper arm papule, question dermatofibroma, this will be sent off to pathology, return to see me in 2 weeks for wound check. Hydrocodone for postoperative pain.    ____________________________________________ Gwen Her. Dianah Field, M.D., ABFM., CAQSM., AME. Primary Care and Sports Medicine [place] [facility]  Adjunct Professor of [place] of [facility] of Medicine  Risk manager

## 2023-01-15 ENCOUNTER — Ambulatory Visit: Payer: Managed Care, Other (non HMO) | Admitting: Sports Medicine

## 2023-01-15 ENCOUNTER — Encounter: Payer: Self-pay | Admitting: Sports Medicine

## 2023-01-15 VITALS — BP 128/96 | HR 78 | Ht 74.0 in | Wt 280.0 lb

## 2023-01-15 DIAGNOSIS — D2361 Other benign neoplasm of skin of right upper limb, including shoulder: Secondary | ICD-10-CM

## 2023-01-15 NOTE — Progress Notes (Signed)
    Procedures performed today:    None.  Independent interpretation of notes and tests performed by another provider:   None.  Brief History, Exam, Impression, and Recommendations:    Dermatofibroma of right upper arm Barry Herrera returns, he is a very pleasant 37 year old male, I saw a skin lesion right upper arm that I suspected to be a dermatofibroma. At the last visit, 2 weeks ago we did a surgical excision with primary closure, pathology did return dermatofibroma, I removed the sutures today and applied some Dermabond, he was having a suture reaction with surrounding erythema, no induration so I do not suspect bacterial infection. I would like him to send me a picture of the incision through MyChart in about 2 weeks.    ____________________________________________ Gwen Her. Dianah Field, M.D., ABFM., CAQSM., AME. Primary Care and Sports Medicine [place] [facility]  Adjunct Professor of [place] of [facility] of Medicine  Risk manager

## 2023-01-15 NOTE — Assessment & Plan Note (Signed)
Barry Herrera returns, he is a very pleasant 37 year old male, I saw a skin lesion right upper arm that I suspected to be a dermatofibroma. At the last visit, 2 weeks ago we did a surgical excision with primary closure, pathology did return dermatofibroma, I removed the sutures today and applied some Dermabond, he was having a suture reaction with surrounding erythema, no induration so I do not suspect bacterial infection. I would like him to send me a picture of the incision through MyChart in about 2 weeks.

## 2023-03-05 ENCOUNTER — Encounter: Payer: Self-pay | Admitting: *Deleted

## 2023-06-08 ENCOUNTER — Ambulatory Visit: Payer: Managed Care, Other (non HMO) | Admitting: Sports Medicine

## 2023-06-11 ENCOUNTER — Ambulatory Visit: Payer: Managed Care, Other (non HMO) | Admitting: Sports Medicine

## 2023-06-11 ENCOUNTER — Ambulatory Visit (INDEPENDENT_AMBULATORY_CARE_PROVIDER_SITE_OTHER): Payer: Managed Care, Other (non HMO)

## 2023-06-11 DIAGNOSIS — M5136 Other intervertebral disc degeneration, lumbar region with discogenic back pain only: Secondary | ICD-10-CM | POA: Insufficient documentation

## 2023-06-11 MED ORDER — GABAPENTIN 300 MG PO CAPS: 300.0000 mg | ORAL_CAPSULE | Freq: Every day | ORAL | 3 refills | Status: AC

## 2023-06-11 MED ORDER — PREDNISONE 50 MG PO TABS: ORAL_TABLET | ORAL | 0 refills | Status: DC

## 2023-06-11 NOTE — Assessment & Plan Note (Signed)
Pleasant 37 year old male, CrossFit or, doing some snatches, power cleans, dead lifts, now having increasing back pain left-sided with radiation down the back and side of the left thigh. Worse with sitting and flexion. We discussed the anatomy and pathophysiology of lumbar disc disease. He will avoid CrossFit, he will do upper body exercises only, home conditioning, 5 days of prednisone, Neurontin at night, x-rays, return to see me in 4 to 6 weeks.

## 2023-06-11 NOTE — Progress Notes (Signed)
    Procedures performed today:    None.  Independent interpretation of notes and tests performed by another provider:   None.  Brief History, Exam, Impression, and Recommendations:    Discogenic low back pain Pleasant 37 year old male, CrossFit or, doing some snatches, power cleans, dead lifts, now having increasing back pain left-sided with radiation down the back and side of the left thigh. Worse with sitting and flexion. We discussed the anatomy and pathophysiology of lumbar disc disease. He will avoid CrossFit, he will do upper body exercises only, home conditioning, 5 days of prednisone, Neurontin at night, x-rays, return to see me in 4 to 6 weeks.    ____________________________________________ Ihor Austin. Benjamin Stain, M.D., ABFM., CAQSM., AME. Primary Care and Sports Medicine [facility]  Adjunct Professor of Family Medicine  [employer] [facility] [employer]  Restaurant manager, [employer]

## 2023-07-02 ENCOUNTER — Encounter (INDEPENDENT_AMBULATORY_CARE_PROVIDER_SITE_OTHER): Payer: Managed Care, Other (non HMO) | Admitting: Sports Medicine

## 2023-07-02 DIAGNOSIS — M5136 Other intervertebral disc degeneration, lumbar region with discogenic back pain only: Secondary | ICD-10-CM

## 2023-07-02 DIAGNOSIS — M545 Low back pain, unspecified: Secondary | ICD-10-CM | POA: Diagnosis not present

## 2023-07-02 MED ORDER — PREDNISONE 10 MG (48) PO TBPK: ORAL_TABLET | Freq: Every day | ORAL | 0 refills | Status: AC

## 2023-07-02 NOTE — Telephone Encounter (Signed)
I spent 5 total minutes of online digital evaluation and management services in this patient-initiated request for online care. 

## 2023-07-24 ENCOUNTER — Ambulatory Visit: Payer: Managed Care, Other (non HMO) | Admitting: Sports Medicine

## 2023-07-24 ENCOUNTER — Encounter: Payer: Self-pay | Admitting: Sports Medicine

## 2023-07-24 DIAGNOSIS — M5136 Other intervertebral disc degeneration, lumbar region with discogenic back pain only: Secondary | ICD-10-CM

## 2023-07-24 NOTE — Assessment & Plan Note (Addendum)
This is a very pleasant 37 year old male returns, he was doing CrossFit, doing some snatches, power cleans, dead lifts and started to have increasing back pain left-sided, radiation of the back side of the left thigh, worse with sitting, flexion. We went over the anatomy and pathophysiology of lumbar disc disease, he is going to avoid CrossFit, we had him do some home conditioning, prednisone, he needed a 12-day taper course afterwards, Neurontin, he is doing pretty good, still has a bit of discomfort, at this point we will going to watch this for now and if he decides the pain is intractable we will proceed with MRI.

## 2023-07-24 NOTE — Progress Notes (Signed)
    Procedures performed today:    None.  Independent interpretation of notes and tests performed by another provider:   None.  Brief History, Exam, Impression, and Recommendations:    Discogenic low back pain This is a very pleasant 37 year old male returns, he was doing Cross[employer], doing some snatches, power cleans, dead lifts and started to have increasing back pain left-sided, radiation of the back side of the left thigh, worse with sitting, flexion. We went over the anatomy and pathophysiology of lumbar disc disease, he is going to avoid CrossFit, we had him do some home conditioning, prednisone, he needed a 12-day taper course afterwards, Neurontin, he is doing pretty good, still has a bit of discomfort, at this point we will going to watch this for now and if he decides the pain is intractable we will proceed with MRI.    ____________________________________________ Ihor Austin. Benjamin Stain, M.D., ABFM., CAQSM., AME. Primary Care and Sports Medicine [facility]  Adjunct Professor of Family Medicine  [employer] [facility] [employer]  Restaurant manager, fast food

## 2024-07-22 ENCOUNTER — Encounter: Payer: Self-pay | Admitting: Sports Medicine
